# Patient Record
Sex: Male | Born: 1985
Health system: Southern US, Community
[De-identification: ages and names within clinical notes are randomized; demographics above are authoritative.]

## PROBLEM LIST (undated history)

## (undated) ENCOUNTER — Ambulatory Visit (HOSPITAL_COMMUNITY): Admission: EM | Payer: Self-pay | Source: Home / Self Care

## (undated) DIAGNOSIS — R7303 Prediabetes: Secondary | ICD-10-CM

## (undated) DIAGNOSIS — I4891 Unspecified atrial fibrillation: Secondary | ICD-10-CM

## (undated) DIAGNOSIS — J45909 Unspecified asthma, uncomplicated: Secondary | ICD-10-CM

## (undated) DIAGNOSIS — I10 Essential (primary) hypertension: Secondary | ICD-10-CM

## (undated) DIAGNOSIS — R51 Headache: Secondary | ICD-10-CM

## (undated) DIAGNOSIS — R519 Headache, unspecified: Secondary | ICD-10-CM

## (undated) HISTORY — PX: NO PAST SURGERIES: SHX2092

---
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
## 2006-01-20 ENCOUNTER — Emergency Department (HOSPITAL_COMMUNITY): Admission: EM | Admit: 2006-01-20 | Discharge: 2006-01-20 | Payer: Self-pay | Admitting: Emergency Medicine

## 2013-12-26 ENCOUNTER — Emergency Department (HOSPITAL_COMMUNITY)
Admission: EM | Admit: 2013-12-26 | Discharge: 2013-12-26 | Disposition: A | Discharge status: Home / Self Care | Payer: Self-pay | Attending: Emergency Medicine | Admitting: Emergency Medicine

## 2013-12-26 ENCOUNTER — Encounter (HOSPITAL_COMMUNITY): Payer: Self-pay | Admitting: Emergency Medicine

## 2013-12-26 DIAGNOSIS — K089 Disorder of teeth and supporting structures, unspecified: Secondary | ICD-10-CM | POA: Insufficient documentation

## 2013-12-26 DIAGNOSIS — K0889 Other specified disorders of teeth and supporting structures: Secondary | ICD-10-CM

## 2013-12-26 DIAGNOSIS — F17200 Nicotine dependence, unspecified, uncomplicated: Secondary | ICD-10-CM | POA: Insufficient documentation

## 2013-12-26 DIAGNOSIS — K029 Dental caries, unspecified: Secondary | ICD-10-CM | POA: Insufficient documentation

## 2013-12-26 DIAGNOSIS — K006 Disturbances in tooth eruption: Secondary | ICD-10-CM | POA: Insufficient documentation

## 2013-12-26 MED ORDER — TRAMADOL-ACETAMINOPHEN 37.5-325 MG PO TABS: 1.0000 | ORAL_TABLET | Freq: Four times a day (QID) | ORAL | Status: DC | PRN

## 2013-12-26 NOTE — ED Provider Notes (Signed)
CSN: [id number]     Arrival date & time 12/26/13  1648 History  This chart was scribed for Sharilyn SitesLisa Jefry Lesinski, PA, working with Glynn OctaveStephen Rancour, MD, by Bronson CurbJacqueline Melvin, ED Scribe. This patient was seen in room TR06C/TR06C and the patient's care was started at 5:04 PM.    Chief Complaint  Patient presents with  . Dental Pain     The history is provided by the patient. No language interpreter was used.    HPI Comments: Jon Nolan is a 28 y.o. male who presents to the Emergency Department complaining of left upper/lower dental pain that began 2 months ago. He states he needs to have a tooth extraction and root canal. Pain worse with chewing hard foods. He denies fever or trouble swallowing. Patient states he has taken Goody's Powder with no relief. Patient does not have a dentist.   History reviewed. No pertinent past medical history. History reviewed. No pertinent past surgical history. No family history on file. History  Substance Use Topics  . Smoking status: Current Every Day Smoker  . Smokeless tobacco: Not on file  . Alcohol Use: Yes    Review of Systems  Constitutional: Negative for fever.  HENT: Positive for dental problem. Negative for trouble swallowing.   All other systems reviewed and are negative.     Allergies  Review of patient's allergies indicates no known allergies.  Home Medications   Prior to Admission medications   Not on File   Triage Vitals: BP 163/111  Pulse 90  Temp(Src) 99 F (37.2 C) (Oral)  Resp 16  Wt 170 lb (77.111 kg)  SpO2 100%  Physical Exam  Nursing note and vitals reviewed. Constitutional: He is oriented to person, place, and time. He appears well-developed and well-nourished.  HENT:  Head: Normocephalic and atraumatic.  Mouth/Throat: Uvula is midline, oropharynx is clear and moist and mucous membranes are normal. Abnormal dentition. Dental caries present. No dental abscesses. No oropharyngeal exudate, posterior oropharyngeal  edema, posterior oropharyngeal erythema or tonsillar abscesses.  Teeth largely in poor dentition, several molars broken with large cavities present, surrounding gingiva noral in appearance without swelling, erythema, or signs of abscess, handling secretions appropriately, no trismus  Eyes: Conjunctivae and EOM are normal. Pupils are equal, round, and reactive to light.  Neck: Normal range of motion.  Cardiovascular: Normal rate, regular rhythm and normal heart sounds.   Pulmonary/Chest: Effort normal and breath sounds normal. No respiratory distress. He has no wheezes.  Musculoskeletal: Normal range of motion.  Neurological: He is alert and oriented to person, place, and time.  Skin: Skin is warm and dry.  Psychiatric: He has a normal mood and affect.    ED Course  Procedures (including critical care time)  DIAGNOSTIC STUDIES: Oxygen Saturation is 100% on room air, normal by my interpretation.    COORDINATION OF CARE: At 1714 Discussed treatment plan with patient which includes Ultracet. Patient agrees.   Labs Review Labs Reviewed - No data to display  Imaging Review No results found.   EKG Interpretation None      MDM   Final diagnoses:  Pain, dental   Dental pain without signs of dental abscess/infection.  ultracet for pain, referral to dentist.  Discussed plan with patient, he/she acknowledged understanding and agreed with plan of care.  Return precautions given for new or worsening symptoms.  I personally performed the services described in this documentation, which was scribed in my presence. The recorded information has been reviewed and is accurate.  Garlon HatchetLisa M Shalise Rosado, PA-C 12/26/13 1921

## 2013-12-26 NOTE — ED Notes (Signed)
Pt c/o left upper and lower dental pain for several months; reports he needs to have root canal and tooth extraction.

## 2013-12-26 NOTE — Discharge Instructions (Signed)
Take the prescribed medication as directed. Follow-up with a dentist.  Referrals and resource guide provided to help with this. Return to the ED for new or worsening symptoms.   Emergency Department Resource Guide 1) Find a Doctor and Pay Out of Pocket Although you won't have to find out who is covered by your insurance plan, it is a good idea to ask around and get recommendations. You will then need to call the office and see if the doctor you have chosen will accept you as a new patient and what types of options they offer for patients who are self-pay. Some doctors offer discounts or will set up payment plans for their patients who do not have insurance, but you will need to ask so you aren't surprised when you get to your appointment.  2) Contact Your Local Health Department Not all health departments have doctors that can see patients for sick visits, but many do, so it is worth a call to see if yours does. If you don't know where your local health department is, you can check in your phone book. The CDC also has a tool to help you locate your state's health department, and many state websites also have listings of all of their local health departments.  3) Find a Walk-in Clinic If your illness is not likely to be very severe or complicated, you may want to try a walk in clinic. These are popping up all over the country in pharmacies, drugstores, and shopping centers. They're usually staffed by nurse practitioners or physician assistants that have been trained to treat common illnesses and complaints. They're usually fairly quick and inexpensive. However, if you have serious medical issues or chronic medical problems, these are probably not your best option.  No Primary Care Doctor: - Call Health Connect at  [phone] - they can help you locate a primary care doctor that  accepts your insurance, provides certain services, etc. - Physician Referral Service- [phone]  Chronic Pain  Problems: Organization         Address  Phone   Notes  [facility]  [phone] Patients need to be referred by their primary care doctor.   Medication Assistance: Organization         Address  Phone   Notes  [facility] [address] [place], [place] [address] [phone] --Must be a resident of [facility] [place] -- Must have NO insurance coverage whatsoever (no Medicaid/ Medicare, etc.) -- The pt. MUST have a primary care doctor that directs their care regularly and follows them in the community   MedAssist  [phone]   [employer]  [phone]    Agencies that provide inexpensive medical care: Organization         Address  Phone   Notes  [facility]  [phone]   [facility]    [phone]   [facility] [address] [place], [place] [address] [phone]   [facility] [address], [place] [phone]) [phone]   [facility]anned [facility]    [phone]   [facility]    [phone]   [facility]  [address], [place] Phone:  [phone], Fax:  [phone] Hours of Operation:  9 am - 6 pm, M-F.  Also accepts Medicaid/Medicare and self-pay.  [facility]  [address]  [place], [place], [place] Phone: [phone], Fax: [phone]. Hours of Operation:  8:30 am - 5:30 pm, M-F.  Also accepts Medicaid and self-pay.  [facility] [place] [address], [place] Phone: [phone]   [place], [place], [place] [phone], Ext. [phone] Mondays & Thursdays: 7-9 AM.  First 15 patients are seen on a first come, first serve basis.    [place] Providers:  Organization         Address  Phone   Notes  [facility] [address], [place] [phone] Also  accepts self-pay patients.  [facility] [address], [place]  [phone]   [place], [address], [place] [phone]   [facility] [address] [place] [phone]   Lucianne Lei [address], [place]   [phone] Only accepts [place] Access [place] patients after they have their name applied to their card.   Self-Pay (no insurance) in [facility]:  Organization         Address  Phone   Notes  Sickle Cell Patients, [facility] [place] [phone]   [facility] [place] [phone]   [facility] [place]  [place], [place],  [phone]   [facility]/Dr. Osei-Bonsu  [address] [place] or [address], [place] [phone] Phone number for both [place] and [place] locations is the same.  [facility] [address], [place] [phone]   [facility] [address] [place] or [address] [phone]   [facility] [address], [place] [phone], phone; [phone], fax Sees patients 1st and 3rd Saturday of every month.  Must not qualify for public or private insurance (i.e. Medicaid, Medicare, [place] Health Choice, Veterans' Benefits)  Household income should be no more than 200% of the poverty level The clinic cannot treat you if you are pregnant or think you are pregnant  Sexually transmitted diseases are not treated at the clinic.    Dental Care: Organization         Address  Phone  Notes  [facility] [place] [phone] Accepts children up to age 34 who are enrolled in [place] or [place]; pregnant  women with a Medicaid card; and children who have applied for Medicaid or [place] Health Choice, but were declined, whose parents can pay a reduced fee at time of service.  [facility]  [address], [place] [phone] Accepts children up to age 24 who are enrolled in [place] or [place]; pregnant women with a Medicaid card; and children who have applied for Medicaid or [place] Health Choice, but were declined, whose parents can pay a reduced fee at time of service.  [facility]  [place] [phone] Patients are seen by appointment only. Walk-ins are not accepted. [place] will see patients 57 years of age and older. Monday - Tuesday (8am-5pm) Most Wednesdays (8:30-5pm) $30 per visit, cash only  [place]  [address], [place] [phone] Patients are seen by appointment only. Walk-ins are not accepted. [facility] will see patients 18 years of age and older. °One Wednesday Evening (Monthly: Volunteer Based).  $30 per visit, cash only  °[facility]  [phone] for adults; Children under age 4, call Graduate Pediatric Dentistry at [phone]. Children aged 4-14, please call [phone] to request a pediatric application. ° Dental services are provided in all areas of dental care including fillings, crowns and bridges, complete and partial dentures, implants, gum treatment, root canals, and extractions. Preventive care is also provided. Treatment is provided to both adults and children. °Patients are selected via a lottery and there is often a waiting list. °  °[facility] [address], °[place] ° [phone] [email or web address] °  °[facility] [address], [place], [place] [phone], Ext. [phone] Second and Fourth Thursday of each month, opens at 6:30 AM; Clinic ends at 9 AM.  Patients are  seen on a first-come first-served basis, and a limited number are seen during each clinic.  ° °[facility] ° [address], [place], [place] [phone]   Eligibility Requirements °You must have lived in [place], [place], or [place] counties for at least the last three months. °  You cannot be eligible for state or federal sponsored healthcare insurance, including Veterans Administration, Medicaid, or Medicare. °  You generally cannot be eligible for healthcare insurance through your employer.  °  How to apply: °Eligibility screenings are held every Tuesday and Wednesday afternoon from 1:00 pm until 4:00 pm. You do not need an appointment for the interview!  °[facility] [address], [place], [place] [phone]   °[facility]  [phone]   °[facility]  [phone]   °[facility]  [phone]   ° °Behavioral Health Resources in the Community: °Intensive Outpatient Programs °Organization         Address  Phone  Notes  °[facility] [address], [place], [place] [phone]   °[facility] [address], [place], [place] [phone]   °[facility] [address], [place], [place] ° [phone]   °[facility] [address],  °[place], [place] 1-800-853-5163 or 336-641-4981   °Substance Abuse Resources °Organization         Address  Phone  Notes  °[facility]  336-882-2125   °[facility]  336-784-9470   °[facility]  336-285-9073   °[facility]  336-845-3988   °Residential & Outpatient Substance Abuse Program  1-800-659-3381   °Psychological Services °Organization         Address  Phone  Notes  ° Health  336- [phone]   °[facility]  336- 378-7881   °[facility] [address], [place] 1-800-853-5163 or 336-641-4981   ° °Mobile Crisis  Teams °Organization         Address  Phone  Notes  °[facility], Mobile Crisis Care Unit  1-877-626-1772   °Assertive °Psychotherapeutic Services ° [address] [place], [place] 336-834-9664   °Sharon DeEsch [address] °[place] 336-554-5454   ° °Self-Help/Support Groups °Organization         Address  Phone             Notes  °[facility] [place] -   variety of support groups  336- [phone] Call for more information  Narcotics Anonymous (NA), [facility] [address], [employer] [place]  2 meetings at this location   Residential Facilities manager         Address  Phone  Notes  [facility] Residential Treatment [facility],    [place]  [phone]   [facility]  [address] [place] [address], [place], [place]   [place], [place] [phone] Admissions: 8am-3pm M-F  Incentives Substance [place] [address]    [place], [place] [phone]   [facility] [address] [place], [place], [place]   [facility] [address],  [place], [place]   [facility] - Intensive Outpatient [address], [place], [place]   [facility] ([place] [employer].,  [place], [place] 1-(248)451-1238 or [phone]   [facility] ([facility]) [address], [place], [place] Accepts Medicaid  [facility] [place] [address],  [place] 1-581-742-9571 Substance Abuse/Addiction Treatment   [facility] Organization         Address  Phone  Notes  [facility]  [phone])[phone]   Domenic Schwab, PhD [address] [place]lis [place], [place]   3230477266 or [phone]   [place], [place] [phone]   [facility] [address], [place], [place] [phone]  Insurance/Medicaid/sponsorship through [facility] and Families [address], [address] [place]                                    [place], [place] [phone] [place] [address], [place] [phone]    Dr. Adele Schilder  [phone]   [facility] Dept. 1) [address] [place] 2) [place] 3)  [place] [address], [place] [phone]  [phone]   [place] [phone] or [phone] (After Hours)

## 2013-12-27 NOTE — ED Provider Notes (Signed)
Medical screening examination/treatment/procedure(s) were performed by non-physician practitioner and as supervising physician I was immediately available for consultation/collaboration.   EKG Interpretation None        Jamarr Treinen, MD 12/27/13 0047 

## 2014-02-19 ENCOUNTER — Encounter (HOSPITAL_COMMUNITY): Payer: Self-pay | Admitting: Emergency Medicine

## 2014-02-19 ENCOUNTER — Emergency Department (HOSPITAL_COMMUNITY)
Admission: EM | Admit: 2014-02-19 | Discharge: 2014-02-19 | Disposition: A | Discharge status: Home / Self Care | Payer: Self-pay | Attending: Emergency Medicine | Admitting: Emergency Medicine

## 2014-02-19 DIAGNOSIS — R221 Localized swelling, mass and lump, neck: Secondary | ICD-10-CM

## 2014-02-19 DIAGNOSIS — J45909 Unspecified asthma, uncomplicated: Secondary | ICD-10-CM | POA: Insufficient documentation

## 2014-02-19 DIAGNOSIS — F17200 Nicotine dependence, unspecified, uncomplicated: Secondary | ICD-10-CM | POA: Insufficient documentation

## 2014-02-19 DIAGNOSIS — K044 Acute apical periodontitis of pulpal origin: Secondary | ICD-10-CM | POA: Insufficient documentation

## 2014-02-19 DIAGNOSIS — R220 Localized swelling, mass and lump, head: Secondary | ICD-10-CM | POA: Insufficient documentation

## 2014-02-19 DIAGNOSIS — R112 Nausea with vomiting, unspecified: Secondary | ICD-10-CM | POA: Insufficient documentation

## 2014-02-19 DIAGNOSIS — K047 Periapical abscess without sinus: Secondary | ICD-10-CM

## 2014-02-19 DIAGNOSIS — IMO0001 Reserved for inherently not codable concepts without codable children: Secondary | ICD-10-CM | POA: Insufficient documentation

## 2014-02-19 HISTORY — DX: Unspecified asthma, uncomplicated: J45.909

## 2014-02-19 LAB — CBC WITH DIFFERENTIAL/PLATELET
Basophils Absolute: 0 10*3/uL (ref 0.0–0.1)
Basophils Relative: 0 % (ref 0–1)
Eosinophils Absolute: 0 10*3/uL (ref 0.0–0.7)
Eosinophils Relative: 0 % (ref 0–5)
HCT: 42 % (ref 39.0–52.0)
HEMOGLOBIN: 14.1 g/dL (ref 13.0–17.0)
LYMPHS ABS: 0.5 10*3/uL — AB (ref 0.7–4.0)
Lymphocytes Relative: 5 % — ABNORMAL LOW (ref 12–46)
MCH: 29 pg (ref 26.0–34.0)
MCHC: 33.6 g/dL (ref 30.0–36.0)
MCV: 86.4 fL (ref 78.0–100.0)
MONOS PCT: 8 % (ref 3–12)
Monocytes Absolute: 0.9 10*3/uL (ref 0.1–1.0)
NEUTROS ABS: 9.1 10*3/uL — AB (ref 1.7–7.7)
NEUTROS PCT: 87 % — AB (ref 43–77)
PLATELETS: 193 10*3/uL (ref 150–400)
RBC: 4.86 MIL/uL (ref 4.22–5.81)
RDW: 14.3 % (ref 11.5–15.5)
WBC: 10.6 10*3/uL — ABNORMAL HIGH (ref 4.0–10.5)

## 2014-02-19 LAB — BASIC METABOLIC PANEL
Anion gap: 16 — ABNORMAL HIGH (ref 5–15)
BUN: 13 mg/dL (ref 6–23)
CHLORIDE: 101 meq/L (ref 96–112)
CO2: 23 mEq/L (ref 19–32)
Calcium: 9.3 mg/dL (ref 8.4–10.5)
Creatinine, Ser: 1.7 mg/dL — ABNORMAL HIGH (ref 0.50–1.35)
GFR calc non Af Amer: 54 mL/min — ABNORMAL LOW (ref >90)
GFR, EST AFRICAN AMERICAN: 63 mL/min — AB (ref >90)
Glucose, Bld: 120 mg/dL — ABNORMAL HIGH (ref 70–99)
Potassium: 3.9 mEq/L (ref 3.7–5.3)
SODIUM: 140 meq/L (ref 137–147)

## 2014-02-19 MED ORDER — IBUPROFEN 600 MG PO TABS: 600.0000 mg | ORAL_TABLET | Freq: Four times a day (QID) | ORAL | Status: DC | PRN

## 2014-02-19 MED ORDER — CLINDAMYCIN PHOSPHATE 600 MG/50ML IV SOLN
600.0000 mg | Freq: Once | INTRAVENOUS | Status: AC
  Administered 2014-02-19: 600 mg via INTRAVENOUS
  Filled 2014-02-19: qty 50

## 2014-02-19 MED ORDER — CLINDAMYCIN HCL 300 MG PO CAPS
300.0000 mg | ORAL_CAPSULE | Freq: Four times a day (QID) | ORAL | Status: DC
Start: 2014-02-19 — End: 2014-04-07

## 2014-02-19 MED ORDER — ONDANSETRON 4 MG PO TBDP: ORAL_TABLET | ORAL | Status: DC

## 2014-02-19 MED ORDER — ONDANSETRON HCL 4 MG/2ML IJ SOLN
4.0000 mg | Freq: Once | INTRAMUSCULAR | Status: AC
  Administered 2014-02-19: 4 mg via INTRAVENOUS
  Filled 2014-02-19: qty 2

## 2014-02-19 MED ORDER — SODIUM CHLORIDE 0.9 % IV BOLUS (SEPSIS)
1000.0000 mL | Freq: Once | INTRAVENOUS | Status: AC
  Administered 2014-02-19: 1000 mL via INTRAVENOUS

## 2014-02-19 MED ORDER — ACETAMINOPHEN 325 MG PO TABS
650.0000 mg | ORAL_TABLET | Freq: Four times a day (QID) | ORAL | Status: DC | PRN
  Administered 2014-02-19: 650 mg via ORAL
  Filled 2014-02-19: qty 2

## 2014-02-19 MED ORDER — KETOROLAC TROMETHAMINE 30 MG/ML IJ SOLN
30.0000 mg | Freq: Once | INTRAMUSCULAR | Status: AC
  Administered 2014-02-19: 30 mg via INTRAVENOUS
  Filled 2014-02-19: qty 1

## 2014-02-19 MED ORDER — HYDROCODONE-ACETAMINOPHEN 5-325 MG PO TABS: 2.0000 | ORAL_TABLET | ORAL | Status: DC | PRN

## 2014-02-19 NOTE — ED Notes (Signed)
Patient reports no nausea after consuming ginger ale at the bedside.

## 2014-02-19 NOTE — ED Provider Notes (Signed)
CSN: [id number]     Arrival date & time 02/19/14  0007 History   First MD Initiated Contact with Patient 02/19/14 0108     Chief Complaint  Patient presents with  . Chills  . Emesis     (Consider location/radiation/quality/duration/timing/severity/associated sxs/prior Treatment) HPI Patient with known dental disease states he's been unable to see a dentist due to expenses. He states he has 5 teeth that need to be pulled. Today he began having gingival swelling in the left lower molar region. He's had fevers and chills. He's had diffuse myalgias. Patient states he's had several episodes of vomiting. He denies any sore throat or difficulty swallowing. He's had no shortness of breath. Patient states he thinks he's had some drainage from the swelling around his gums. Past Medical History  Diagnosis Date  . Asthma    History reviewed. No pertinent past surgical history. History reviewed. No pertinent family history. History  Substance Use Topics  . Smoking status: Current Every Day Smoker  . Smokeless tobacco: Not on file  . Alcohol Use: Yes    Review of Systems  Constitutional: Positive for fever, chills and fatigue.  HENT: Positive for dental problem and facial swelling. Negative for sinus pressure and sore throat.   Respiratory: Negative for cough and shortness of breath.   Cardiovascular: Negative for chest pain.  Gastrointestinal: Positive for nausea and vomiting. Negative for abdominal pain and diarrhea.  Musculoskeletal: Positive for myalgias. Negative for back pain, neck pain and neck stiffness.  Skin: Negative for rash and wound.  Neurological: Negative for dizziness, weakness, light-headedness and numbness.  All other systems reviewed and are negative.     Allergies  Review of patient's allergies indicates no known allergies.  Home Medications   Prior to Admission medications   Not on File   BP 145/87  Pulse 81  Temp(Src) 100.5 F (38.1 C) (Oral)  Resp 16   SpO2 98% Physical Exam  Nursing note and vitals reviewed. Constitutional: He is oriented to person, place, and time. He appears well-developed and well-nourished. No distress.  HENT:  Head: Normocephalic and atraumatic.  Mouth/Throat: Oropharynx is clear and moist. No oropharyngeal exudate.    Eyes: EOM are normal. Pupils are equal, round, and reactive to light.  Neck: Normal range of motion. Neck supple.  No meningismus  Cardiovascular: Normal rate and regular rhythm.   Pulmonary/Chest: Effort normal and breath sounds normal. No respiratory distress. He has no wheezes. He has no rales. He exhibits no tenderness.  Abdominal: Soft. Bowel sounds are normal. He exhibits no distension and no mass. There is no tenderness. There is no rebound and no guarding.  Musculoskeletal: Normal range of motion. He exhibits no edema and no tenderness.  No calf tenderness or swelling.  Neurological: He is alert and oriented to person, place, and time.  Moves all extremities without deficit. Sensation is grossly intact.  Skin: Skin is warm and dry. No rash noted. No erythema.  Psychiatric: He has a normal mood and affect. His behavior is normal.    ED Course  Procedures (including critical care time) Labs Review Labs Reviewed  CBC WITH DIFFERENTIAL - Abnormal; Notable for the following:    WBC 10.6 (*)    Neutrophils Relative % 87 (*)    Neutro Abs 9.1 (*)    Lymphocytes Relative 5 (*)    Lymphs Abs 0.5 (*)    All other components within normal limits  BASIC METABOLIC PANEL - Abnormal; Notable for the following:  Glucose, Bld 120 (*)    Creatinine, Ser 1.70 (*)    GFR calc non Af Amer 54 (*)    GFR calc Af Amer 63 (*)    Anion gap 16 (*)    All other components within normal limits    Imaging Review No results found.   EKG Interpretation None      MDM   Final diagnoses:  None    Patient with likely spontaneously draining periapical dental abscess. We'll give IV fluids, and  NSAID and antibiotics. Patient will need to followup with a dentist for extraction.  Patient is feeling much better after medications and IV fluids. Heart rate improved. Patient is tolerating oral fluid. Encouraged to followup with a dentist. Return precautions given  Loren Raceravid Kenny Rea, MD 02/19/14 [phone]

## 2014-02-19 NOTE — ED Notes (Signed)
Provided ginger ale as requested ?

## 2014-02-19 NOTE — ED Notes (Signed)
Presents with chills, fever, vomiting and bodyaches. Pt states, "I have five teeth I am supposed to have removed but now my gums are swollen and they have never been swollen. I have been having chills and vomiting and I can't keep anything on my stomach" denies diarrhea.

## 2014-02-19 NOTE — Discharge Instructions (Signed)

## 2014-04-07 ENCOUNTER — Emergency Department (HOSPITAL_COMMUNITY)
Admission: EM | Admit: 2014-04-07 | Discharge: 2014-04-07 | Disposition: A | Discharge status: Home / Self Care | Payer: Self-pay | Attending: Emergency Medicine | Admitting: Emergency Medicine

## 2014-04-07 ENCOUNTER — Encounter (HOSPITAL_COMMUNITY): Payer: Self-pay | Admitting: Emergency Medicine

## 2014-04-07 DIAGNOSIS — F17200 Nicotine dependence, unspecified, uncomplicated: Secondary | ICD-10-CM | POA: Insufficient documentation

## 2014-04-07 DIAGNOSIS — R519 Headache, unspecified: Secondary | ICD-10-CM

## 2014-04-07 DIAGNOSIS — R51 Headache: Secondary | ICD-10-CM

## 2014-04-07 DIAGNOSIS — R109 Unspecified abdominal pain: Secondary | ICD-10-CM | POA: Insufficient documentation

## 2014-04-07 DIAGNOSIS — J45909 Unspecified asthma, uncomplicated: Secondary | ICD-10-CM | POA: Insufficient documentation

## 2014-04-07 LAB — COMPREHENSIVE METABOLIC PANEL
ALK PHOS: 43 U/L (ref 39–117)
ALT: 19 U/L (ref 0–53)
ANION GAP: 13 (ref 5–15)
AST: 28 U/L (ref 0–37)
Albumin: 3.5 g/dL (ref 3.5–5.2)
BILIRUBIN TOTAL: 1.1 mg/dL (ref 0.3–1.2)
BUN: 9 mg/dL (ref 6–23)
CHLORIDE: 104 meq/L (ref 96–112)
CO2: 23 mEq/L (ref 19–32)
Calcium: 8.5 mg/dL (ref 8.4–10.5)
Creatinine, Ser: 1.15 mg/dL (ref 0.50–1.35)
GFR calc Af Amer: >90 mL/min (ref >90)
GFR calc non Af Amer: 87 mL/min — ABNORMAL LOW (ref >90)
Glucose, Bld: 95 mg/dL (ref 70–99)
Potassium: 4.1 mEq/L (ref 3.7–5.3)
SODIUM: 140 meq/L (ref 137–147)
Total Protein: 6.1 g/dL (ref 6.0–8.3)

## 2014-04-07 LAB — URINALYSIS, ROUTINE W REFLEX MICROSCOPIC
Bilirubin Urine: NEGATIVE
GLUCOSE, UA: NEGATIVE mg/dL
HGB URINE DIPSTICK: NEGATIVE
KETONES UR: 40 mg/dL — AB
LEUKOCYTES UA: NEGATIVE
Nitrite: NEGATIVE
PH: 6.5 (ref 5.0–8.0)
Protein, ur: NEGATIVE mg/dL
Specific Gravity, Urine: 1.023 (ref 1.005–1.030)
Urobilinogen, UA: 0.2 mg/dL (ref 0.0–1.0)

## 2014-04-07 LAB — CBC
HEMATOCRIT: 41.3 % (ref 39.0–52.0)
HEMOGLOBIN: 14.1 g/dL (ref 13.0–17.0)
MCH: 29.4 pg (ref 26.0–34.0)
MCHC: 34.1 g/dL (ref 30.0–36.0)
MCV: 86 fL (ref 78.0–100.0)
Platelets: 212 10*3/uL (ref 150–400)
RBC: 4.8 MIL/uL (ref 4.22–5.81)
RDW: 14.3 % (ref 11.5–15.5)
WBC: 10.5 10*3/uL (ref 4.0–10.5)

## 2014-04-07 LAB — LIPASE, BLOOD: Lipase: 19 U/L (ref 11–59)

## 2014-04-07 MED ORDER — DIPHENHYDRAMINE HCL 25 MG PO CAPS
25.0000 mg | ORAL_CAPSULE | Freq: Once | ORAL | Status: AC
  Administered 2014-04-07: 25 mg via ORAL
  Filled 2014-04-07: qty 1

## 2014-04-07 MED ORDER — PROCHLORPERAZINE EDISYLATE 5 MG/ML IJ SOLN
10.0000 mg | Freq: Once | INTRAMUSCULAR | Status: DC
  Filled 2014-04-07: qty 2

## 2014-04-07 MED ORDER — KETOROLAC TROMETHAMINE 60 MG/2ML IM SOLN
60.0000 mg | Freq: Once | INTRAMUSCULAR | Status: DC
  Filled 2014-04-07: qty 2

## 2014-04-07 MED ORDER — KETOROLAC TROMETHAMINE 30 MG/ML IJ SOLN
30.0000 mg | Freq: Once | INTRAMUSCULAR | Status: AC
  Administered 2014-04-07: 30 mg via INTRAVENOUS
  Filled 2014-04-07: qty 1

## 2014-04-07 MED ORDER — NAPROXEN 500 MG PO TABS: 500.0000 mg | ORAL_TABLET | Freq: Two times a day (BID) | ORAL | Status: DC

## 2014-04-07 MED ORDER — PROCHLORPERAZINE EDISYLATE 5 MG/ML IJ SOLN
10.0000 mg | Freq: Once | INTRAMUSCULAR | Status: AC
  Administered 2014-04-07: 10 mg via INTRAVENOUS

## 2014-04-07 NOTE — ED Notes (Signed)
Patient encouraged to give a urine sample again at this time.

## 2014-04-07 NOTE — ED Provider Notes (Signed)
CSN: [id number]     Arrival date & time 04/07/14  0148 History   First MD Initiated Contact with Patient 04/07/14 0203     Chief Complaint  Patient presents with  . Abdominal Pain     (Consider location/radiation/quality/duration/timing/severity/associated sxs/prior Treatment) HPI Comments: 28 year old male with no significant past medical history, no prior abdominal surgical history and no history of cholecystitis, pancreatitis or appendicitis. He had acute onset of left upper and left mid abdominal pain that occurred approximately 2 hours prior to arrival, is a constant sensation, associated with nausea and vomiting and is gradually improving. He states that it is sharp and stabbing, does not radiate to his back nor does it radiate to his flank, scrotum or testicles. He denies any changes in his bowel habits including no diarrhea, no constipation, no blood in the stools and he has no dysuria, hematuria or any other complaints. There has been no fevers or chills, yesterday was a fairly normal day without any decrease in appetite or any other systemic complaints. He does report that he has developed a headache over the last hour, he states that he does get frequent headaches. He does have sensitivity to light.  Patient is a 28 y.o. male presenting with abdominal pain. The history is provided by the patient.  Abdominal Pain   Past Medical History  Diagnosis Date  . Asthma    History reviewed. No pertinent past surgical history. No family history on file. History  Substance Use Topics  . Smoking status: Current Every Day Smoker  . Smokeless tobacco: Not on file  . Alcohol Use: Yes    Review of Systems  Gastrointestinal: Positive for abdominal pain.  All other systems reviewed and are negative.     Allergies  Review of patient's allergies indicates no known allergies.  Home Medications   Prior to Admission medications   Medication Sig Start Date End Date Taking? Authorizing  Provider  clindamycin (CLEOCIN) 300 MG capsule Take 1 capsule (300 mg total) by mouth 4 (four) times daily. X 7 days 02/19/14   Loren Racer, MD  HYDROcodone-acetaminophen [facility]) 5-325 MG per tablet Take 2 tablets by mouth every 4 (four) hours as needed. 02/19/14   Loren Racer, MD  ibuprofen (ADVIL,MOTRIN) 600 MG tablet Take 1 tablet (600 mg total) by mouth every 6 (six) hours as needed. 02/19/14   Loren Racer, MD  ondansetron (ZOFRAN ODT) 4 MG disintegrating tablet  ODT q4 hours prn nausea/vomit 02/19/14   Loren Racer, MD   BP 142/82  Temp(Src) 98.3 F (36.8 C) (Oral)  Resp 12  SpO2 98% Physical Exam  Nursing note and vitals reviewed. Constitutional: He appears well-developed and well-nourished. No distress.  HENT:  Head: Normocephalic and atraumatic.  Mouth/Throat: Oropharynx is clear and moist. No oropharyngeal exudate.  Eyes: Conjunctivae and EOM are normal. Pupils are equal, round, and reactive to light. Right eye exhibits no discharge. Left eye exhibits no discharge. No scleral icterus.  Neck: Normal range of motion. Neck supple. No JVD present. No thyromegaly present.  Cardiovascular: Normal rate, regular rhythm, normal heart sounds and intact distal pulses.  Exam reveals no gallop and no friction rub.   No murmur heard. Pulmonary/Chest: Effort normal and breath sounds normal. No respiratory distress. He has no wheezes. He has no rales.  Abdominal: Soft. Bowel sounds are normal. He exhibits no distension and no mass. There is tenderness (mild left upper quadrant, left midabdomen and left lower quadrant tenderness).  No CVA tenderness  Musculoskeletal: Normal  range of motion. He exhibits no edema and no tenderness.  Lymphadenopathy:    He has no cervical adenopathy.  Neurological: He is alert. Coordination normal.  Skin: Skin is warm and dry. No rash noted. No erythema.  Psychiatric: He has a normal mood and affect. His behavior is normal.    ED Course   Procedures (including critical care time) Labs Review Labs Reviewed  COMPREHENSIVE METABOLIC PANEL  LIPASE, BLOOD  CBC  URINALYSIS, ROUTINE W REFLEX MICROSCOPIC    Imaging Review No results found.    MDM   Final diagnoses:  None     There is no pain on the right side of the abdomen, no right upper quadrant or right lower quadrant tenderness, no peritoneal signs, normal bowel sounds and no diarrhea. Possible kidney stone, less likely to be cholecystitis or pancreatitis or peptic ulcer disease. The patient does endorse some alcohol use but denies over-the-counter medication use. At this time laboratory workup, Toradol, Compazine, Benadryl.  Improved and has been sleeping after medications, no pain on reexam, headache improved, vital signs normal  Meds given in ED:  Medications  diphenhydrAMINE (BENADRYL) capsule 25 mg (25 mg Oral Given 04/07/14 0229)  ketorolac (TORADOL) 30 MG/ML injection 30 mg (30 mg Intravenous Given 04/07/14 0229)  prochlorperazine (COMPAZINE) injection 10 mg (10 mg Intravenous Given 04/07/14 0229)    New Prescriptions   NAPROXEN (NAPROSYN) 500 MG TABLET    Take 1 tablet (500 mg total) by mouth 2 (two) times daily with a meal.   '  Vida Roller, MD 04/07/14 [phone]

## 2014-04-07 NOTE — ED Notes (Signed)
Patient presents to ED via [facility] from home. Patient states that he had a sudden onset of left upper and lower abdominal pain with associated nausea and vomiting. Denies any diarrhea. Tenderness noted to left side. VSS. EMS administered 4 mg zofran en route- patient states that it helped. A&Ox4. No acute distress noted at this time.

## 2014-04-07 NOTE — ED Notes (Signed)
Patient still has not provided a urine sample, but encouraged to do so.

## 2014-04-07 NOTE — Discharge Instructions (Signed)
°Emergency Department Resource Guide °1) Find a Doctor and Pay Out of Pocket °Although you won't have to find out who is covered by your insurance plan, it is a good idea to ask around and get recommendations. You will then need to call the office and see if the doctor you have chosen will accept you as a new patient and what types of options they offer for patients who are self-pay. Some doctors offer discounts or will set up payment plans for their patients who do not have insurance, but you will need to ask so you aren't surprised when you get to your appointment. ° °2) Contact Your Local Health Department °Not all health departments have doctors that can see patients for sick visits, but many do, so it is worth a call to see if yours does. If you don't know where your local health department is, you can check in your phone book. The CDC also has a tool to help you locate your state's health department, and many state websites also have listings of all of their local health departments. ° °3) Find a Walk-in Clinic °If your illness is not likely to be very severe or complicated, you may want to try a walk in clinic. These are popping up all over the country in pharmacies, drugstores, and shopping centers. They're usually staffed by nurse practitioners or physician assistants that have been trained to treat common illnesses and complaints. They're usually fairly quick and inexpensive. However, if you have serious medical issues or chronic medical problems, these are probably not your best option. ° °No Primary Care Doctor: °- Call [facility] at  [phone] - they can help you locate a primary care doctor that  accepts your insurance, provides certain services, etc. °- Physician Referral Service- 1-800-533-3463 ° °Chronic Pain Problems: °Organization         Address  Phone   Notes  °[facility]  [phone] Patients need to be referred by their primary care doctor.  ° °Medication  Assistance: °Organization         Address  Phone   Notes  °[place] [facility] [address] °[place], [place] [address] [phone]) [phone] --Must be a resident of [place] °-- Must have NO insurance coverage whatsoever (no Medicaid/ Medicare, etc.) °-- The pt. MUST have a primary care doctor that directs their care regularly and follows them in the community °  °[facility]  (866) 331-1348   °[employer]  (888) 892-1162   ° °Agencies that provide inexpensive medical care: °Organization         Address  Phone   Notes  °[facility]  ([phone]) [phone]   °[facility]    ([phone]) [phone]   °[facility] [address] °[place],  [address] [phone]) [phone]   °[facility] [address], °[place] ([phone]) [phone]   °[facility]    ([phone]) 373-0678   °[facility]    ([phone]) [phone]   °[facility] ° [address], [place] Phone:  ([phone]) [phone], Fax:  ([phone] Hours of Operation:  9 am - 6 pm, M-F.  Also accepts Medicaid/Medicare and self-pay.  °[facility] ° [address], [place] Phone: ([phone]) [phone], Fax: [phone]. Hours of Operation:  8:30 am - 5:30 pm, M-F.  Also accepts Medicaid and self-pay.  °[facility] [place] [address], [place] Phone: ([phone]) [phone]   °[facility] [address], [place], [place] (336)[phone], Ext. 123 Mondays & Thursdays: 7-9 AM.  First 15 patients are seen on a first come, first serve basis. °  ° °Medicaid-accepting [place] Providers: ° °Organization         Address  Phone   Notes  °[facility] [address], [place] [phone]) [phone] Also accepts self-pay patients.  °[facility] [address], [place] ° ([phone]) [phone]   °[facility] [address], [place]  ([phone]) [phone]   °[facility] [address] [place] [address], [place] ([phone]) [phone]   °[facility] [address], [place]  ° ([phone]) [phone] Only accepts [place] [employer] Medicaid patients after they have their name applied to their card.  ° °Self-Pay (no insurance) in [place]: ° °Organization         Address  Phone   Notes  °Sickle Cell Patients, [facility] [address], [place] ([phone]) [phone]   °[facility] [address], [place] ([phone]) [phone]   ° [facility] ° [address], [place] ([phone]) [phone]   °[facility]/Dr. Osei-Bonsu ° [address] [place] [address], [place] or [address], [place] [phone] Phone number for both [place] and [place] locations is the same.  °[facility] [address], [place] ([phone]) [phone]   °[facility] [address] [place] [address], [place] or [address] ([phone]) [phone] °([phone]) [phone]   °[facility] [address],  ([phone]) [phone], phone; ([phone]) [phone], fax Sees patients 1st and 3rd Saturday of every month.  Must not qualify for public or private insurance (i.e. Medicaid, Medicare, [place] Health Choice, Veterans' Benefits) • Household income should be no more than 200% of the poverty level •The clinic cannot treat you if you are pregnant or think you are pregnant • Sexually transmitted diseases are not treated at the clinic.  ° °

## 2014-06-17 ENCOUNTER — Emergency Department (HOSPITAL_COMMUNITY)
Admission: EM | Admit: 2014-06-17 | Discharge: 2014-06-17 | Disposition: A | Discharge status: Home / Self Care | Payer: Self-pay | Attending: Emergency Medicine | Admitting: Emergency Medicine

## 2014-06-17 ENCOUNTER — Encounter (HOSPITAL_COMMUNITY): Payer: Self-pay

## 2014-06-17 DIAGNOSIS — J45909 Unspecified asthma, uncomplicated: Secondary | ICD-10-CM | POA: Insufficient documentation

## 2014-06-17 DIAGNOSIS — G43109 Migraine with aura, not intractable, without status migrainosus: Secondary | ICD-10-CM | POA: Insufficient documentation

## 2014-06-17 DIAGNOSIS — G43009 Migraine without aura, not intractable, without status migrainosus: Secondary | ICD-10-CM

## 2014-06-17 DIAGNOSIS — Z791 Long term (current) use of non-steroidal anti-inflammatories (NSAID): Secondary | ICD-10-CM | POA: Insufficient documentation

## 2014-06-17 DIAGNOSIS — Z720 Tobacco use: Secondary | ICD-10-CM | POA: Insufficient documentation

## 2014-06-17 MED ORDER — SODIUM CHLORIDE 0.9 % IV BOLUS (SEPSIS)
1000.0000 mL | Freq: Once | INTRAVENOUS | Status: AC
  Administered 2014-06-17: 1000 mL via INTRAVENOUS

## 2014-06-17 MED ORDER — KETOROLAC TROMETHAMINE 30 MG/ML IJ SOLN
30.0000 mg | Freq: Once | INTRAMUSCULAR | Status: AC
  Administered 2014-06-17: 30 mg via INTRAVENOUS
  Filled 2014-06-17: qty 1

## 2014-06-17 MED ORDER — MAGNESIUM SULFATE 2 GM/50ML IV SOLN
2.0000 g | Freq: Once | INTRAVENOUS | Status: AC
  Administered 2014-06-17: 2 g via INTRAVENOUS
  Filled 2014-06-17: qty 50

## 2014-06-17 MED ORDER — DIPHENHYDRAMINE HCL 50 MG/ML IJ SOLN
50.0000 mg | Freq: Once | INTRAMUSCULAR | Status: AC
  Administered 2014-06-17: 50 mg via INTRAVENOUS
  Filled 2014-06-17: qty 1

## 2014-06-17 MED ORDER — METOCLOPRAMIDE HCL 5 MG/ML IJ SOLN
10.0000 mg | Freq: Once | INTRAMUSCULAR | Status: AC
  Administered 2014-06-17: 10 mg via INTRAVENOUS
  Filled 2014-06-17: qty 2

## 2014-06-17 NOTE — ED Provider Notes (Signed)
CSN: [id number]     Arrival date & time 06/17/14  1022 History   First MD Initiated Contact with Patient 06/17/14 1032     Chief Complaint  Patient presents with  . Headache     (Consider location/radiation/quality/duration/timing/severity/associated sxs/prior Treatment) HPI  Patient reports he suffered a concussion when he was in the fifth grade. He describes getting frequent headaches that occur about 3 times a week. He states he normally takes Excedrin in the headaches go away. He states yesterday morning about 3:30 AM he started having a headache that was typical for his migraines. It starts at the back of his head and comes up into his temples with throbbing in his temples. He had nausea and vomiting twice today. He denies diarrhea or any visual changes. He has photophobia and noise sensitivity. He states he also feels like the room is spinning. He has some tingling in his left fingers. He states he has seen a neurologist before for his back but 96 head. He has however had a CAT scan of his head before. He states he took Excedrin today but it did not help his pain.   PCP none  Past Medical History  Diagnosis Date  . Asthma   Migraine headache   History reviewed. No pertinent past surgical history. History reviewed. No pertinent family history. History  Substance Use Topics  . Smoking status: Current Every Day Smoker -- 0.25 packs/day    Types: Cigarettes  . Smokeless tobacco: Not on file  . Alcohol Use: 0.6 oz/week    1 Cans of beer per week  employed Smokes 6 cigs a day Drinks 1 beer a week  Review of Systems  All other systems reviewed and are negative.     Allergies  Review of patient's allergies indicates no known allergies.  Home Medications   Prior to Admission medications   Medication Sig Start Date End Date Taking? Authorizing Provider  aspirin-acetaminophen-caffeine (EXCEDRIN MIGRAINE) 7241554109250-250-65 MG per tablet Take 2 tablets by mouth every 8 (eight)  hours as needed for headache.   Yes Historical Provider, MD  naproxen (NAPROSYN) 500 MG tablet Take 1 tablet (500 mg total) by mouth 2 (two) times daily with a meal. Patient not taking: Reported on 06/17/2014 04/07/14   Vida RollerBrian D Miller, MD   BP 136/92 mmHg  Pulse 63  Temp(Src) 97.5 F (36.4 C) (Oral)  Resp 18  Ht 6' (1.829 m)  Wt 164 lb (74.39 kg)  BMI 22.24 kg/m2  SpO2 100%  Vital signs normal    Physical Exam  Constitutional: He is oriented to person, place, and time. He appears well-developed and well-nourished.  Non-toxic appearance. He does not appear ill. No distress.  Appears painful, sitting in dark room with TV playing  HENT:  Head: Normocephalic and atraumatic.  Right Ear: External ear normal.  Left Ear: External ear normal.  Nose: Nose normal. No mucosal edema or rhinorrhea.  Mouth/Throat: Oropharynx is clear and moist and mucous membranes are normal. No dental abscesses or uvula swelling.  Eyes: Conjunctivae and EOM are normal. Pupils are equal, round, and reactive to light.  Neck: Normal range of motion and full passive range of motion without pain. Neck supple.  Cardiovascular: Normal rate, regular rhythm and normal heart sounds.  Exam reveals no gallop and no friction rub.   No murmur heard. Pulmonary/Chest: Effort normal and breath sounds normal. No respiratory distress. He has no wheezes. He has no rhonchi. He has no rales. He exhibits no tenderness and  no crepitus.  Abdominal: Soft. Normal appearance and bowel sounds are normal. He exhibits no distension. There is no tenderness. There is no rebound and no guarding.  Musculoskeletal: Normal range of motion. He exhibits no edema or tenderness.  Moves all extremities well.   Neurological: He is alert and oriented to person, place, and time. He has normal strength. No cranial nerve deficit.  Skin: Skin is warm, dry and intact. No rash noted. No erythema. No pallor.  Psychiatric: He has a normal mood and affect. His  speech is normal and behavior is normal. His mood appears not anxious.  Nursing note and vitals reviewed.   ED Course  Procedures (including critical care time)  Medications  sodium chloride 0.9 % bolus 1,000 mL (0 mLs Intravenous Stopped 06/17/14 1345)  diphenhydrAMINE (BENADRYL) injection 50 mg (50 mg Intravenous Given 06/17/14 1144)  metoCLOPramide (REGLAN) injection 10 mg (10 mg Intravenous Given 06/17/14 1145)  ketorolac (TORADOL) 30 MG/ML injection 30 mg (30 mg Intravenous Given 06/17/14 1143)  magnesium sulfate IVPB 2 g 50 mL (0 g Intravenous Stopped 06/17/14 1350)    Recheck 13:20 pt still having headache, only minimally improved.  Recheck 14:30 pt states his headache is gone, has been able to eat crackers and drink fluids. We discussed seeing a neurologist for his frequent headaches, he may benefit from prophylaxis.    Labs Review Labs Reviewed - No data to display  Imaging Review No results found.   EKG Interpretation None      MDM   Final diagnoses:  Migraine without aura and without status migrainosus, not intractable    Plan discharge  Devoria AlbeIva Alenah Sarria, MD, [facility]P     Forbes Loll L Ashlyne Olenick, MD 06/17/14 1455

## 2014-06-17 NOTE — ED Notes (Signed)
Pt has had ongoing headache since approximately 0300 this morning. Pt states the "lights make it hurt worse." Pt states he took 2 Excedrin tablets at home with no relief. Pt is alert and oriented.

## 2014-06-17 NOTE — Discharge Instructions (Signed)
Go home and rest. Consider being evaluated by a neurologist to discuss medications to help prevent your frequent headaches. Return to the ED if you feel worse again.    Migraine Headache A migraine headache is an intense, throbbing pain on one or both sides of your head. A migraine can last for 30 minutes to several hours. CAUSES  The exact cause of a migraine headache is not always known. However, a migraine may be caused when nerves in the brain become irritated and release chemicals that cause inflammation. This causes pain. Certain things may also trigger migraines, such as:  Alcohol.  Smoking.  Stress.  Menstruation.  Aged cheeses.  Foods or drinks that contain nitrates, glutamate, aspartame, or tyramine.  Lack of sleep.  Chocolate.  Caffeine.  Hunger.  Physical exertion.  Fatigue.  Medicines used to treat chest pain (nitroglycerine), birth control pills, estrogen, and some blood pressure medicines. SIGNS AND SYMPTOMS  Pain on one or both sides of your head.  Pulsating or throbbing pain.  Severe pain that prevents daily activities.  Pain that is aggravated by any physical activity.  Nausea, vomiting, or both.  Dizziness.  Pain with exposure to bright lights, loud noises, or activity.  General sensitivity to bright lights, loud noises, or smells. Before you get a migraine, you may get warning signs that a migraine is coming (aura). An aura may include:  Seeing flashing lights.  Seeing bright spots, halos, or zigzag lines.  Having tunnel vision or blurred vision.  Having feelings of numbness or tingling.  Having trouble talking.  Having muscle weakness. DIAGNOSIS  A migraine headache is often diagnosed based on:  Symptoms.  Physical exam.  A CT scan or MRI of your head. These imaging tests cannot diagnose migraines, but they can help rule out other causes of headaches. TREATMENT Medicines may be given for pain and nausea. Medicines can also  be given to help prevent recurrent migraines.  HOME CARE INSTRUCTIONS  Only take over-the-counter or prescription medicines for pain or discomfort as directed by your health care provider. The use of long-term narcotics is not recommended.  Lie down in a dark, quiet room when you have a migraine.  Keep a journal to find out what may trigger your migraine headaches. For example, write down:  What you eat and drink.  How much sleep you get.  Any change to your diet or medicines.  Limit alcohol consumption.  Quit smoking if you smoke.  Get 7-9 hours of sleep, or as recommended by your health care provider.  Limit stress.  Keep lights dim if bright lights bother you and make your migraines worse. SEEK IMMEDIATE MEDICAL CARE IF:   Your migraine becomes severe.  You have a fever.  You have a stiff neck.  You have vision loss.  You have muscular weakness or loss of muscle control.  You start losing your balance or have trouble walking.  You feel faint or pass out.  You have severe symptoms that are different from your first symptoms. MAKE SURE YOU:   Understand these instructions.  Will watch your condition.  Will get help right away if you are not doing well or get worse. Document Released: 07/25/2005 Document Revised: 12/09/2013 Document Reviewed: 04/01/2013 [facility] Patient Information 2015 [place], [place]. This information is not intended to replace advice given to you by your health care provider. Make sure you discuss any questions you have with your health care provider.

## 2014-06-26 NOTE — Progress Notes (Signed)
[facility] Specialist Spoke with patient regarding primary care resources and establishing care with a provider. Resource guide and my contact information provided for any future questions or concerns.

## 2014-08-28 ENCOUNTER — Emergency Department (HOSPITAL_COMMUNITY)
Admission: EM | Admit: 2014-08-28 | Discharge: 2014-08-29 | Disposition: A | Discharge status: Home / Self Care | Payer: Self-pay | Attending: Emergency Medicine | Admitting: Emergency Medicine

## 2014-08-28 ENCOUNTER — Encounter (HOSPITAL_COMMUNITY): Payer: Self-pay | Admitting: Emergency Medicine

## 2014-08-28 DIAGNOSIS — G44209 Tension-type headache, unspecified, not intractable: Secondary | ICD-10-CM | POA: Insufficient documentation

## 2014-08-28 DIAGNOSIS — J45909 Unspecified asthma, uncomplicated: Secondary | ICD-10-CM | POA: Insufficient documentation

## 2014-08-28 DIAGNOSIS — Z791 Long term (current) use of non-steroidal anti-inflammatories (NSAID): Secondary | ICD-10-CM | POA: Insufficient documentation

## 2014-08-28 DIAGNOSIS — Z720 Tobacco use: Secondary | ICD-10-CM | POA: Insufficient documentation

## 2014-08-28 LAB — CBG MONITORING, ED: Glucose-Capillary: 86 mg/dL (ref 70–99)

## 2014-08-28 MED ORDER — SODIUM CHLORIDE 0.9 % IV BOLUS (SEPSIS)
1000.0000 mL | Freq: Once | INTRAVENOUS | Status: AC
  Administered 2014-08-28: 1000 mL via INTRAVENOUS

## 2014-08-28 MED ORDER — KETOROLAC TROMETHAMINE 30 MG/ML IJ SOLN
30.0000 mg | Freq: Once | INTRAMUSCULAR | Status: AC
  Administered 2014-08-28: 30 mg via INTRAVENOUS
  Filled 2014-08-28: qty 1

## 2014-08-28 MED ORDER — METOCLOPRAMIDE HCL 5 MG/ML IJ SOLN
10.0000 mg | Freq: Once | INTRAMUSCULAR | Status: AC
  Administered 2014-08-28: 10 mg via INTRAVENOUS
  Filled 2014-08-28: qty 2

## 2014-08-28 MED ORDER — DIPHENHYDRAMINE HCL 50 MG/ML IJ SOLN
25.0000 mg | Freq: Once | INTRAMUSCULAR | Status: AC
  Administered 2014-08-28: 25 mg via INTRAVENOUS
  Filled 2014-08-28: qty 1

## 2014-08-28 NOTE — ED Notes (Signed)
Pt placed into gown and on monitor upon arrival to room. Pt monitored by blood pressure, pulse ox, and 5 lead.  

## 2014-08-28 NOTE — ED Notes (Signed)
Pt. reports headache , dizziness, near syncope at work this evening , and mild blurred vision , no nausea or vomitting .

## 2014-08-28 NOTE — ED Provider Notes (Signed)
CSN: [id number]     Arrival date & time 08/28/14  2104 History   First MD Initiated Contact with Patient 08/28/14 2121     Chief Complaint  Patient presents with  . Headache     (Consider location/radiation/quality/duration/timing/severity/associated sxs/prior Treatment) Patient is a 29 y.o. male presenting with headaches.  Headache Pain location:  Frontal and R temporal Quality: thorbbing. Radiates to:  Does not radiate Severity currently:  4/10 Severity at highest:  8/10 Onset quality:  Gradual Duration:  1 day Timing:  Constant Progression:  Waxing and waning Chronicity:  New Context comment:  Pt was doing non strenuous acitivity at work Relieved by:  Nothing Worsened by:  Nothing tried Ineffective treatments:  None tried Associated symptoms: blurred vision (mild) and dizziness   Associated symptoms: no back pain, no congestion, no cough, no near-syncope, no sinus pressure, no sore throat, no syncope, no tingling and no URI     Past Medical History  Diagnosis Date  . Asthma    History reviewed. No pertinent past surgical history. No family history on file. History  Substance Use Topics  . Smoking status: Current Every Day Smoker -- 0.25 packs/day    Types: Cigarettes  . Smokeless tobacco: Not on file  . Alcohol Use: 0.6 oz/week    1 Cans of beer per week    Review of Systems  HENT: Negative for congestion, sinus pressure and sore throat.   Eyes: Positive for blurred vision (mild).  Respiratory: Negative for cough.   Cardiovascular: Negative for syncope and near-syncope.  Musculoskeletal: Negative for back pain.  Neurological: Positive for dizziness and headaches.    10 Systems reviewed and are negative for acute change except as noted in the HPI.    Allergies  Review of patient's allergies indicates no known allergies.  Home Medications   Prior to Admission medications   Medication Sig Start Date End Date Taking? Authorizing Provider   aspirin-acetaminophen-caffeine (EXCEDRIN MIGRAINE) [phone] MG per tablet Take 2 tablets by mouth every 8 (eight) hours as needed for headache.   Yes Historical Provider, MD  naproxen (NAPROSYN) 500 MG tablet Take 1 tablet (500 mg total) by mouth 2 (two) times daily with a meal. Patient not taking: Reported on 06/17/2014 04/07/14   Vida Roller, MD  ondansetron (ZOFRAN) 4 MG tablet Take 1 tablet (4 mg total) by mouth every 6 (six) hours. 08/29/14   Malie Kashani Irine Seal, PA-C  traMADol (ULTRAM) 50 MG tablet Take 1 tablet (50 mg total) by mouth every 6 (six) hours as needed. 08/29/14   Zetta Stoneman Irine Seal, PA-C   BP 124/85 mmHg  Pulse 68  Temp(Src) 97.9 F (36.6 C) (Oral)  Resp 20  Ht 6' (1.829 m)  Wt 160 lb (72.576 kg)  BMI 21.70 kg/m2  SpO2 98% Physical Exam  Constitutional: He appears well-developed and well-nourished. No distress.  HENT:  Head: Normocephalic and atraumatic.  Eyes: Pupils are equal, round, and reactive to light.  Neck: Normal range of motion. Neck supple. No spinous process tenderness and no muscular tenderness present. No Brudzinski's sign and no Kernig's sign noted.  Cardiovascular: Normal rate and regular rhythm.   Pulmonary/Chest: Effort normal.  Abdominal: Soft.  Neurological: He is alert.  Cranial nerves II-VIII and X-XII evaluated and show no deficits. Pt alert and oriented x 3 Upper and lower extremity strength is symmetrical and physiologic Normal muscular tone No facial droop Coordination intact, no limb ataxia, finger-nose-finger normal Rapid alternating movements normal No pronator drift  Skin: Skin is warm and dry.  Nursing note and vitals reviewed.   ED Course  Procedures (including critical care time) Labs Review Labs Reviewed  CBG MONITORING, ED    Imaging Review No results found.   EKG Interpretation None      MDM   Final diagnoses:  Tension-type headache, not intractable, unspecified chronicity pattern    Medications  sodium  chloride 0.9 % bolus 1,000 mL (0 mLs Intravenous Stopped 08/28/14 2347)  diphenhydrAMINE (BENADRYL) injection 25 mg (25 mg Intravenous Given 08/28/14 2232)  metoCLOPramide (REGLAN) injection 10 mg (10 mg Intravenous Given 08/28/14 2232)  ketorolac (TORADOL) 30 MG/ML injection 30 mg (30 mg Intravenous Given 08/28/14 2346)    The patients pain easily resolved with IV pain medication and fluids. The patient denies any symptoms of neurological impairment or TIA's; no amaurosis, diplopia, dysphasia, or unilateral disturbance of motor or sensory function. No loss of balance or vertigo.  29 y.o.Rafi L Huckins's evaluation in the Emergency Department is complete. It has been determined that no acute conditions requiring further emergency intervention are present at this time. The patient/guardian have been advised of the diagnosis and plan. We have discussed signs and symptoms that warrant return to the ED, such as changes or worsening in symptoms.  Vital signs are stable at discharge. Filed Vitals:   08/29/14 0030  BP: 124/85  Pulse: 68  Temp:   Resp: 20    Patient/guardian has voiced understanding and agreed to follow-up with the PCP or specialist.     Dorthula Matasiffany G Berniece Abid, PA-C 09/02/14 1620  Toy BakerAnthony T Allen, MD 09/05/14 [phone]

## 2014-08-28 NOTE — ED Notes (Signed)
CBG = 86 Reported to RN

## 2014-08-28 NOTE — ED Notes (Signed)
Pt was able to complete orthostatic vital signs with no assistance needed with position changes. Pt did state he felt dizzy while standing.

## 2014-08-29 MED ORDER — TRAMADOL HCL 50 MG PO TABS: 50.0000 mg | ORAL_TABLET | Freq: Four times a day (QID) | ORAL | Status: DC | PRN

## 2014-08-29 MED ORDER — ONDANSETRON HCL 4 MG PO TABS: 4.0000 mg | ORAL_TABLET | Freq: Four times a day (QID) | ORAL | Status: DC

## 2014-08-29 NOTE — Discharge Instructions (Signed)

## 2015-07-25 ENCOUNTER — Encounter (HOSPITAL_COMMUNITY): Payer: Self-pay | Admitting: Emergency Medicine

## 2015-07-25 ENCOUNTER — Emergency Department (HOSPITAL_COMMUNITY)
Admission: EM | Admit: 2015-07-25 | Discharge: 2015-07-25 | Disposition: A | Discharge status: Home / Self Care | Payer: Self-pay | Attending: Emergency Medicine | Admitting: Emergency Medicine

## 2015-07-25 ENCOUNTER — Emergency Department (HOSPITAL_COMMUNITY): Payer: Self-pay

## 2015-07-25 DIAGNOSIS — K0889 Other specified disorders of teeth and supporting structures: Secondary | ICD-10-CM | POA: Insufficient documentation

## 2015-07-25 DIAGNOSIS — K089 Disorder of teeth and supporting structures, unspecified: Secondary | ICD-10-CM

## 2015-07-25 DIAGNOSIS — F17210 Nicotine dependence, cigarettes, uncomplicated: Secondary | ICD-10-CM | POA: Insufficient documentation

## 2015-07-25 DIAGNOSIS — G8929 Other chronic pain: Secondary | ICD-10-CM | POA: Insufficient documentation

## 2015-07-25 DIAGNOSIS — J45909 Unspecified asthma, uncomplicated: Secondary | ICD-10-CM | POA: Insufficient documentation

## 2015-07-25 DIAGNOSIS — K602 Anal fissure, unspecified: Secondary | ICD-10-CM | POA: Insufficient documentation

## 2015-07-25 DIAGNOSIS — R197 Diarrhea, unspecified: Secondary | ICD-10-CM | POA: Insufficient documentation

## 2015-07-25 HISTORY — DX: Headache: R51

## 2015-07-25 HISTORY — DX: Headache, unspecified: R51.9

## 2015-07-25 LAB — CBC WITH DIFFERENTIAL/PLATELET
BASOS ABS: 0.1 10*3/uL (ref 0.0–0.1)
Basophils Relative: 1 %
EOS ABS: 0 10*3/uL (ref 0.0–0.7)
Eosinophils Relative: 0 %
HCT: 39.3 % (ref 39.0–52.0)
Hemoglobin: 13.4 g/dL (ref 13.0–17.0)
LYMPHS PCT: 14 %
Lymphs Abs: 1.4 10*3/uL (ref 0.7–4.0)
MCH: 29.5 pg (ref 26.0–34.0)
MCHC: 34.1 g/dL (ref 30.0–36.0)
MCV: 86.6 fL (ref 78.0–100.0)
Monocytes Absolute: 1.1 10*3/uL — ABNORMAL HIGH (ref 0.1–1.0)
Monocytes Relative: 11 %
Neutro Abs: 7.5 10*3/uL (ref 1.7–7.7)
Neutrophils Relative %: 74 %
Platelets: 179 10*3/uL (ref 150–400)
RBC: 4.54 MIL/uL (ref 4.22–5.81)
RDW: 13.9 % (ref 11.5–15.5)
WBC: 10.1 10*3/uL (ref 4.0–10.5)

## 2015-07-25 LAB — COMPREHENSIVE METABOLIC PANEL
ALT: 13 U/L — ABNORMAL LOW (ref 17–63)
AST: 17 U/L (ref 15–41)
Albumin: 4.2 g/dL (ref 3.5–5.0)
Alkaline Phosphatase: 51 U/L (ref 38–126)
Anion gap: 10 (ref 5–15)
BILIRUBIN TOTAL: 0.8 mg/dL (ref 0.3–1.2)
BUN: 8 mg/dL (ref 6–20)
CO2: 26 mmol/L (ref 22–32)
Calcium: 9.2 mg/dL (ref 8.9–10.3)
Chloride: 105 mmol/L (ref 101–111)
Creatinine, Ser: 1.35 mg/dL — ABNORMAL HIGH (ref 0.61–1.24)
GFR calc Af Amer: >60 mL/min (ref >60)
Glucose, Bld: 85 mg/dL (ref 65–99)
POTASSIUM: 3.8 mmol/L (ref 3.5–5.1)
Sodium: 141 mmol/L (ref 135–145)
TOTAL PROTEIN: 6.8 g/dL (ref 6.5–8.1)

## 2015-07-25 LAB — LIPASE, BLOOD: Lipase: 23 U/L (ref 11–51)

## 2015-07-25 MED ORDER — ACETAMINOPHEN 325 MG PO TABS
650.0000 mg | ORAL_TABLET | Freq: Once | ORAL | Status: AC
  Administered 2015-07-25: 650 mg via ORAL

## 2015-07-25 MED ORDER — ACETAMINOPHEN 325 MG PO TABS
ORAL_TABLET | ORAL | Status: AC
  Filled 2015-07-25: qty 2

## 2015-07-25 NOTE — ED Notes (Signed)
Pt with bright red blood in stool, little ongoing Tuesday evening but today more blood noted on toilet paper and in toilet

## 2015-07-25 NOTE — ED Provider Notes (Signed)
CSN: [id number]     Arrival date & time 07/25/15  1722 History   First MD Initiated Contact with Patient 07/25/15 1731     Chief Complaint  Patient presents with  . Rectal Bleeding  . Diarrhea      HPI Pt was seen at 1740. Per pt, c/o gradual onset and persistence of multiple intermittent episodes of "rectal bleeding" for the past 4 days. Pt describes his symptoms as "seeing red blood on the toilet paper" after he wipes after having a BM. States he has had several episodes of "green" and "watery" diarrhea for the past 2 weeks. Denies N/V, no abd pain, no fevers, no back pain, no black stools, no rash.    Past Medical History  Diagnosis Date  . Asthma   . Headache    History reviewed. No pertinent past surgical history.  Social History  Substance Use Topics  . Smoking status: Current Every Day Smoker -- 0.25 packs/day    Types: Cigarettes  . Smokeless tobacco: None  . Alcohol Use: 0.6 oz/week    1 Cans of beer per week    Review of Systems ROS: Statement: All systems negative except as marked or noted in the HPI; Constitutional: Negative for fever and chills. ; ; Eyes: Negative for eye pain, redness and discharge. ; ; ENMT: Negative for ear pain, hoarseness, nasal congestion, sinus pressure and sore throat. ; ; Cardiovascular: Negative for chest pain, palpitations, diaphoresis, dyspnea and peripheral edema. ; ; Respiratory: Negative for cough, wheezing and stridor. ; ; Gastrointestinal: Negative for nausea, vomiting, abdominal pain, blood in stool, hematemesis, jaundice and +diarrhea, +rectal bleeding. . ; ; Genitourinary: Negative for dysuria, flank pain and hematuria. ; ; Musculoskeletal: Negative for back pain and neck pain. Negative for swelling and trauma.; ; Skin: Negative for pruritus, rash, abrasions, blisters, bruising and skin lesion.; ; Neuro: Negative for headache, lightheadedness and neck stiffness. Negative for weakness, altered level of consciousness , altered mental  status, extremity weakness, paresthesias, involuntary movement, seizure and syncope.      Allergies  Review of patient's allergies indicates no known allergies.  Home Medications   Prior to Admission medications   Medication Sig Start Date End Date Taking? Authorizing Provider  naproxen (NAPROSYN) 500 MG tablet Take 1 tablet (500 mg total) by mouth 2 (two) times daily with a meal. Patient not taking: Reported on 06/17/2014 04/07/14   Eber Hong, MD  ondansetron (ZOFRAN) 4 MG tablet Take 1 tablet (4 mg total) by mouth every 6 (six) hours. 08/29/14   Tiffany Neva Seat, PA-C  traMADol (ULTRAM) 50 MG tablet Take 1 tablet (50 mg total) by mouth every 6 (six) hours as needed. 08/29/14   Tiffany Neva Seat, PA-C   BP 153/98 mmHg  Pulse 91  Temp(Src) 98.4 F (36.9 C) (Oral)  Resp 18  Ht 6' (1.829 m)  Wt 163 lb (73.936 kg)  BMI 22.10 kg/m2  SpO2 100% Physical Exam  1745: Physical examination:  Nursing notes reviewed; Vital signs and O2 SAT reviewed;  Constitutional: Well developed, Well nourished, Well hydrated, In no acute distress; Head:  Normocephalic, atraumatic; Eyes: EOMI, PERRL, No scleral icterus; ENMT: Mouth and pharynx normal, Mucous membranes moist; Neck: Supple, Full range of motion, No lymphadenopathy; Cardiovascular: Regular rate and rhythm, No murmur, rub, or gallop; Respiratory: Breath sounds clear & equal bilaterally, No rales, rhonchi, wheezes.  Speaking full sentences with ease, Normal respiratory effort/excursion; Chest: Nontender, Movement normal; Abdomen: Soft, Nontender, Nondistended, Normal bowel sounds. Rectal exam performed w/permission  of pt and ED RN chaperone present.  Anal tone normal.  +small fissure without active bleeding. No external hemorrhoids, no palp masses.; Genitourinary: No CVA tenderness; Extremities: Pulses normal, No tenderness, No edema, No calf edema or asymmetry.; Neuro: AA&Ox3, Major CN grossly intact.  Speech clear. No gross focal motor or sensory deficits  in extremities.; Skin: Color normal, Warm, Dry.   ED Course  Procedures (including critical care time) Labs Review   Imaging Review  I have personally reviewed and evaluated these images and lab results as part of my medical decision-making.   EKG Interpretation None      MDM  MDM Reviewed: previous chart, nursing note and vitals Reviewed previous: labs Interpretation: labs and x-ray     Results for orders placed or performed during the hospital encounter of 07/25/15  Comprehensive metabolic panel  Result Value Ref Range   Sodium 141 135 - 145 mmol/L   Potassium 3.8 3.5 - 5.1 mmol/L   Chloride 105 101 - 111 mmol/L   CO2 26 22 - 32 mmol/L   Glucose, Bld 85 65 - 99 mg/dL   BUN 8 6 - 20 mg/dL   Creatinine, Ser 1.61 (H) 0.61 - 1.24 mg/dL   Calcium 9.2 8.9 - 09.6 mg/dL   Total Protein 6.8 6.5 - 8.1 g/dL   Albumin 4.2 3.5 - 5.0 g/dL   AST 17 15 - 41 U/L   ALT 13 (L) 17 - 63 U/L   Alkaline Phosphatase 51 38 - 126 U/L   Total Bilirubin 0.8 0.3 - 1.2 mg/dL   GFR calc non Af Amer >60 >60 mL/min   GFR calc Af Amer >60 >60 mL/min   Anion gap 10 5 - 15  CBC with Differential  Result Value Ref Range   WBC 10.1 4.0 - 10.5 K/uL   RBC 4.54 4.22 - 5.81 MIL/uL   Hemoglobin 13.4 13.0 - 17.0 g/dL   HCT 04.5 40.9 - 81.1 %   MCV 86.6 78.0 - 100.0 fL   MCH 29.5 26.0 - 34.0 pg   MCHC 34.1 30.0 - 36.0 g/dL   RDW 91.4 78.2 - 95.6 %   Platelets 179 150 - 400 K/uL   Neutrophils Relative % 74 %   Neutro Abs 7.5 1.7 - 7.7 K/uL   Lymphocytes Relative 14 %   Lymphs Abs 1.4 0.7 - 4.0 K/uL   Monocytes Relative 11 %   Monocytes Absolute 1.1 (H) 0.1 - 1.0 K/uL   Eosinophils Relative 0 %   Eosinophils Absolute 0.0 0.0 - 0.7 K/uL   Basophils Relative 1 %   Basophils Absolute 0.1 0.0 - 0.1 K/uL  Lipase, blood  Result Value Ref Range   Lipase 23 11 - 51 U/L   Dg Abd Acute W/chest 07/25/2015  CLINICAL DATA:  Diarrhea for 3 weeks. EXAM: DG ABDOMEN ACUTE W/ 1V CHEST COMPARISON:  None.  FINDINGS: The cardiomediastinal contours are normal. The lungs are clear. There is no free intra-abdominal air. No dilated bowel loops to suggest obstruction. Small volume of stool throughout the colon. No radiopaque calculi. No acute osseous abnormalities are seen. IMPRESSION: Nonobstructive bowel gas pattern.  No free air. Electronically Signed   By: Rubye Oaks M.D.   On: 07/25/2015 19:03    1935:  Pt has tol PO well while in the ED without N/V.  No stooling while in the ED.  Abd remains benign, VSS. Feels better and wants to go home now. Pt now requesting "some pain medicine" for "  bad teeth" he has "had for a while" that he "needs a root canal on."  ENMT: Mouth and pharynx normal, Poor dentition, Widespread dental decay, Left TM normal, Right TM normal, Mucous membranes moist, +upper and lower left 2nd molars with dental decay.  No gingival erythema, edema, fluctuance, or drainage.  No intra-oral edema. No submandibular or sublingual edema. No hoarse voice, no drooling, no stridor. No trismus. Pt encouraged to f/u with dentist or oral surgeon for his dental needs for good continuity of care and definitive treatment.  Pt verb understanding. Dx and testing d/w pt and family.  Questions answered.  Verb understanding, agreeable to d/c home with outpt f/u.     Samuel JesterKathleen Emilliano Dilworth, DO 07/28/15 2029

## 2015-07-25 NOTE — Discharge Instructions (Signed)
°Emergency Department Resource Guide °1) Find a Doctor and Pay Out of Pocket °Although you won't have to find out who is covered by your insurance plan, it is a good idea to ask around and get recommendations. You will then need to call the office and see if the doctor you have chosen will accept you as a new patient and what types of options they offer for patients who are self-pay. Some doctors offer discounts or will set up payment plans for their patients who do not have insurance, but you will need to ask so you aren't surprised when you get to your appointment. ° °2) Contact Your Local Health Department °Not all health departments have doctors that can see patients for sick visits, but many do, so it is worth a call to see if yours does. If you don't know where your local health department is, you can check in your phone book. The CDC also has a tool to help you locate your state's health department, and many state websites also have listings of all of their local health departments. ° °3) Find a Walk-in Clinic °If your illness is not likely to be very severe or complicated, you may want to try a walk in clinic. These are popping up all over the country in pharmacies, drugstores, and shopping centers. They're usually staffed by nurse practitioners or physician assistants that have been trained to treat common illnesses and complaints. They're usually fairly quick and inexpensive. However, if you have serious medical issues or chronic medical problems, these are probably not your best option. ° °No Primary Care Doctor: °- Call Health Connect at  832-8000 - they can help you locate a primary care doctor that  accepts your insurance, provides certain services, etc. °- Physician Referral Service- 1-800-533-3463 ° °Chronic Pain Problems: °Organization         Address  Phone   Notes  °[facility]  (336) 297-2271 Patients need to be referred by their primary care doctor.  ° °Medication  Assistance: °Organization         Address  Phone   Notes  °[place] [facility] [address] °[place], [place] [address] (336) 641-8030 --Must be a resident of [place] °-- Must have NO insurance coverage whatsoever (no Medicaid/ Medicare, etc.) °-- The pt. MUST have a primary care doctor that directs their care regularly and follows them in the community °  °[facility]  (866) 331-1348   °[employer]  (888) 892-1162   ° °Agencies that provide inexpensive medical care: °Organization         Address  Phone   Notes  °[facility]  (336) 832-8035   °[facility]    (336) 832-7272   °[facility] [address] °[place], [place] [address] (336) 832-4777   °[facility] [address], °[place] (336) 271-4999   °[facility]    (336) 373-0678   °[facility]    (336) 272-1050   °[facility] ° [address], [place] Phone:  (336) 832-4444, Fax:  (336) 832-4440 Hours of Operation:  9 am - 6 pm, M-F.  Also accepts Medicaid/Medicare and self-pay.  °[facility] ° [address], [place] Phone: (336) 832-3150, Fax: (336) [phone]. Hours of Operation:  8:30 am - 5:30 pm, M-F.  Also accepts Medicaid and self-pay.  °[facility] [place] [address], [place] Phone: (336) 878-6027   °[facility] [address], [place], [place] (336)723-1848, Ext. 123 Mondays & Thursdays: 7-9 AM.  First 15 patients are seen on a first come, first serve basis. °  ° °Medicaid-accepting [place] Providers: ° °Organization         Address  Phone   Notes  °[facility] [address], [place] (336) 641-2100 Also accepts self-pay patients.  °[facility] [address], [place] ° (336) 856-9996   °[facility] [address], [place]  (336) 288-8857   °[facility] [address] [place] [address], [place] (336) 299-7000   °Veita Bland [address], [place]  ° (336) 373-1557 Only accepts [place] Access Medicaid patients after they have their name applied to their card.  ° °Self-Pay (no insurance) in [place]: ° °Organization         Address  Phone   Notes  °Sickle Cell Patients, [facility] [address], [place] (336) 832-1970   °[facility] [address], [place] (336) 832-4400   °[facility] ° [address], [place] (336) 992-4800   °[facility]/Dr. Osei-Bonsu ° [address] [place] [address], [place] or [address], [place] (336) 841-8500 Phone number for both [place] and [place] locations is the same.  °[facility] [address], [place] (336) 299-0000   °[facility] [address] [place] [address], [place] or [address] (336) 852-7530 °(336) 878-2260   °[facility] [address], [place] (336) 350-1642, phone; (336) 294-5005, fax Sees patients 1st and 3rd Saturday of every month.  Must not qualify for public or private insurance (i.e. Medicaid, Medicare, [place] Health Choice, Veterans' Benefits) • Household income should be no more than 200% of the poverty level •The clinic cannot treat you if you are pregnant or think you are pregnant • Sexually transmitted diseases are not treated at the clinic.  ° ° °Dental Care: °Organization         Address  Phone  Notes  °[place] [facility] [address], [place] (336) 641-6152 Accepts children up to age 21 who are enrolled in Medicaid or [place] Health Choice; pregnant women with a Medicaid card; and children who have applied for Medicaid or [place] Health Choice, but were declined, whose parents can pay a reduced fee at time of service.  °[place]  [facility] [place]  [address], [place] (336) 641-7733 Accepts children up to age 21 who are enrolled in Medicaid or [place] Health Choice; pregnant women with a Medicaid card; and children who have applied for Medicaid or [place] Health Choice, but were declined, whose parents can pay a reduced fee at time of service.  °[facility] ° [address], [place] (336) 641-4533 Patients are seen by appointment only. Walk-ins are not accepted. [facility] will see patients 18 years of age and older. °Monday - Tuesday (8am-5pm) °Most Wednesdays (8:30-5pm) °$30 per visit, cash only  °[facility] ° [address], [place] (336) 641-4533 Patients are seen by appointment only. Walk-ins are not accepted. [facility] will see patients 18 years of age and older. °One   Wednesday Evening (Monthly: Volunteer Based).  $30 per visit, cash only  °[facility]  (919) 537-3737 for adults; Children under age 4, call Graduate Pediatric Dentistry at ([phone]) [phone]. Children aged 4-14, please call (919) [phone] to request a pediatric application. ° Dental services are provided in all areas of dental care including fillings, crowns and bridges, complete and partial dentures, implants, gum treatment, root canals, and extractions. Preventive care is also provided. Treatment is provided to both adults and children. °Patients are selected via a lottery and there is often a waiting list. °  °[facility] [address], °[place] ° (336) 763-8833 [email or web address] °  °[facility] [address], [place], [place] (336)723-1848, Ext. 123 Second and Fourth Thursday of each month, opens at 6:30 AM; Clinic ends at 9 AM.  Patients are seen on a first-come first-served basis, and a limited number are seen during each clinic.  ° °[facility] ° [address], [place], [place] (336) 723-7904    Eligibility Requirements °You must have lived in [place], [place], or [place] counties for at least the last three months. °  You cannot be eligible for state or federal sponsored healthcare insurance, including Veterans Administration, Medicaid, or Medicare. °  You generally cannot be eligible for healthcare insurance through your employer.  °  How to apply: °Eligibility screenings are held every Tuesday and Wednesday afternoon from 1:00 pm until 4:00 pm. You do not need an appointment for the interview!  °[facility] [address], [place], [place] 336-631-2330   °[facility]  336-342-8273   °[facility]  336-703-3100   °[facility]  336-570-6415   ° °Behavioral Health Resources in the Community: °Intensive Outpatient Programs °Organization         Address  Phone  Notes  °[facility] [address], [place], [place] 336-878-6098   °[facility] [address], [place], [place] 336-832-9800   °[facility] [address], [place], [place] ° 336-882-2125   °[facility] [address],  °[place], [place] 1-800-853-5163 or 336-641-4981   °Substance Abuse Resources °Organization         Address  Phone  Notes  °[facility]  336-882-2125   °[facility]  336-784-9470   °[facility]  336-285-9073   °[facility]  336-845-3988   °Residential & Outpatient Substance Abuse Program  1-800-659-3381   °Psychological Services °Organization         Address  Phone  Notes  °[facility]  336- 832-9600   °[facility]  336- 378-7881   °[facility] [address], [place] 1-800-853-5163 or 336-641-4981   ° °Mobile Crisis Teams °Organization         Address  Phone  Notes  °[facility], Mobile Crisis Care Unit  1-877-626-1772   °Assertive °[facility] ° [address]  [place], [place] 336-834-9664   °Sharon DeEsch [address] °[place] 336-554-5454   ° °Self-Help/Support Groups °Organization         Address  Phone             Notes  °[facility]oc[facility] of  - variety of support groups  336- 373-1402 Call for more information  °Narcotics Anonymous (NA), [facility] [address], °[place]  2 meetings at this location  ° °  Residential Treatment Programs Organization         Address  Phone  Notes  [facility] [address],    [place]  [phone])[phone]   [facility]  [address], [place] [phone], [place], [place] [phone]   [facility] [address], [place] [phone] Admissions: 8am-3pm M-F  [facility] [address]    [place], [place] [phone] [phone]   [facility] [address], [place], [place] [phone]   [facility] [address]  [place], [place] [phone]   [facility] Intensive Outpatient [address], [place], [place] [phone]   [facility] ([facility] [address]  [place], [place] [phone] or [phone]   [facility] ([facility]) [address] [place], [place] [phone] Accepts Medicaid  [facility] [address]  [place] [phone] Substance Abuse/Addiction Treatment   [facility] Organization         Address  Phone  Notes  [facility]  514-202-4444(888) (248)[phone]   Angie FavaJulie Brannon, PhD [address] [place], [place]   [phone](336) (703)[phone] or ([phone](336) ([phone]) [phone]   [facility]   [address] [place], [place] [phone](336) [phone]   [facility] [address], [place], [place] ([phone](336) [phone] Insurance/Medicaid/sponsorship through [facility]  [facility] [address]                                    [place], [place] [phone](336) [phone] Therapy/tele-psych/case    [facility] [address]   [place], [place] [phone](336) [phone]    Dr. Lolly MustacheArfeen  [phone](336) [phone]   [facility]  [employer] [facility] 1) [address], [place] 2) [address], [place] 3)  [address], [place] [phone](336) [phone](336) [phone]  [phone](336) [phone]   [facility] Child Abuse Hotline (310) 438-0651(336) (509)147-1798 or 743-404-0021(336) [phone] (After Hours)      Take over the counter tylenol, as directed on packaging, as needed for discomfort.  Increase your fluid intake (ie:  Gatoraide) for the next few days.   Eat a bland diet and advance to your regular diet slowly as you can tolerate it.   Avoid full strength juices, as well as milk and milk products until your diarrhea has resolved.   Call your regular medical doctor, the Dentist, and the GI doctor on Monday to schedule a follow up appointment this week.  Return to the Emergency Department immediately sooner if worsening.

## 2017-06-13 ENCOUNTER — Emergency Department (HOSPITAL_COMMUNITY)
Admission: EM | Admit: 2017-06-13 | Discharge: 2017-06-14 | Disposition: A | Discharge status: Home / Self Care | Payer: Self-pay | Attending: Emergency Medicine | Admitting: Emergency Medicine

## 2017-06-13 ENCOUNTER — Encounter (HOSPITAL_COMMUNITY): Payer: Self-pay

## 2017-06-13 DIAGNOSIS — J45909 Unspecified asthma, uncomplicated: Secondary | ICD-10-CM | POA: Insufficient documentation

## 2017-06-13 DIAGNOSIS — R55 Syncope and collapse: Secondary | ICD-10-CM

## 2017-06-13 DIAGNOSIS — G43009 Migraine without aura, not intractable, without status migrainosus: Secondary | ICD-10-CM | POA: Insufficient documentation

## 2017-06-13 DIAGNOSIS — F17210 Nicotine dependence, cigarettes, uncomplicated: Secondary | ICD-10-CM | POA: Insufficient documentation

## 2017-06-13 LAB — CBC
HEMATOCRIT: 38.4 % — AB (ref 39.0–52.0)
Hemoglobin: 12.7 g/dL — ABNORMAL LOW (ref 13.0–17.0)
MCH: 28.7 pg (ref 26.0–34.0)
MCHC: 33.1 g/dL (ref 30.0–36.0)
MCV: 86.9 fL (ref 78.0–100.0)
PLATELETS: 207 10*3/uL (ref 150–400)
RBC: 4.42 MIL/uL (ref 4.22–5.81)
RDW: 13.8 % (ref 11.5–15.5)
WBC: 8.1 10*3/uL (ref 4.0–10.5)

## 2017-06-13 LAB — URINALYSIS, ROUTINE W REFLEX MICROSCOPIC
BILIRUBIN URINE: NEGATIVE
Glucose, UA: NEGATIVE mg/dL
Hgb urine dipstick: NEGATIVE
KETONES UR: NEGATIVE mg/dL
LEUKOCYTES UA: NEGATIVE
NITRITE: NEGATIVE
PROTEIN: NEGATIVE mg/dL
Specific Gravity, Urine: 1.016 (ref 1.005–1.030)
pH: 6 (ref 5.0–8.0)

## 2017-06-13 LAB — BASIC METABOLIC PANEL
Anion gap: 7 (ref 5–15)
BUN: 8 mg/dL (ref 6–20)
CALCIUM: 8.7 mg/dL — AB (ref 8.9–10.3)
CO2: 26 mmol/L (ref 22–32)
CREATININE: 1.27 mg/dL — AB (ref 0.61–1.24)
Chloride: 105 mmol/L (ref 101–111)
GFR calc Af Amer: >60 mL/min (ref >60)
Glucose, Bld: 97 mg/dL (ref 65–99)
POTASSIUM: 3.4 mmol/L — AB (ref 3.5–5.1)
SODIUM: 138 mmol/L (ref 135–145)

## 2017-06-13 MED ORDER — SODIUM CHLORIDE 0.9 % IV BOLUS (SEPSIS)
1000.0000 mL | Freq: Once | INTRAVENOUS | Status: AC
  Administered 2017-06-14: 1000 mL via INTRAVENOUS

## 2017-06-13 NOTE — ED Triage Notes (Signed)
Pt states that he had a syncopal episode today after work, unknown if he hit his head, c/o of headache and neck pain. Pt states that he has had a migraine for the past three days, vomited x 1

## 2017-06-14 MED ORDER — DIPHENHYDRAMINE HCL 50 MG/ML IJ SOLN
25.0000 mg | Freq: Once | INTRAMUSCULAR | Status: AC
  Administered 2017-06-14: 25 mg via INTRAVENOUS
  Filled 2017-06-14: qty 1

## 2017-06-14 MED ORDER — PROCHLORPERAZINE EDISYLATE 5 MG/ML IJ SOLN
10.0000 mg | Freq: Once | INTRAMUSCULAR | Status: AC
  Administered 2017-06-14: 10 mg via INTRAVENOUS
  Filled 2017-06-14: qty 2

## 2017-06-14 NOTE — ED Provider Notes (Signed)
[facility] Provider Note   CSN: [id number] Arrival date & time: 06/13/17  2239     History   Chief Complaint Chief Complaint  Patient presents with  . Loss of Consciousness    HPI Jon Nolan is a 31 y.o. male.  HPI  This is a 31 year old male with a history of migraines who presents with headache and syncope.  Patient reports 3-day history of headache consistent with his prior migraines.  He has taken ibuprofen with minimal relief.  Reports photophobia.  No weakness, numbness, tingling, vision changes.  Currently he rates his pain at 9 out of 10.  States that he got off work and was walking to his car.  He felt very lightheaded.  He states next thing he knew he was on the ground.  He reports left neck pain.  Denies any vomiting.  Unsure if he hit his head.  Denies any chest pain or shortness of breath.  No early family history of sudden death.  Does report history of vasovagal syncope with blood draw.  Past Medical History:  Diagnosis Date  . Asthma   . Headache     There are no active problems to display for this patient.   History reviewed. No pertinent surgical history.     Home Medications    Prior to Admission medications   Medication Sig Start Date End Date Taking? Authorizing Provider  naproxen (NAPROSYN) 500 MG tablet Take 1 tablet (500 mg total) by mouth 2 (two) times daily with a meal. Patient not taking: Reported on 06/17/2014 04/07/14   Eber HongMiller, Brian, MD  ondansetron (ZOFRAN) 4 MG tablet Take 1 tablet (4 mg total) by mouth every 6 (six) hours. Patient not taking: Reported on 06/14/2017 08/29/14   Marlon PelGreene, Tiffany, PA-C  traMADol (ULTRAM) 50 MG tablet Take 1 tablet (50 mg total) by mouth every 6 (six) hours as needed. Patient not taking: Reported on 06/14/2017 08/29/14   Marlon PelGreene, Tiffany, PA-C    Family History No family history on file.  Social History Social History   Tobacco Use  . Smoking status: Current  Every Day Smoker    Packs/day: 0.25    Types: Cigarettes  . Smokeless tobacco: Never Used  Substance Use Topics  . Alcohol use: Yes    Alcohol/week: 0.6 oz    Types: 1 Cans of beer per week  . Drug use: No     Allergies   Patient has no known allergies.   Review of Systems Review of Systems  Constitutional: Negative for fever.  Eyes: Positive for photophobia. Negative for visual disturbance.  Respiratory: Negative for shortness of breath.   Cardiovascular: Negative for chest pain.  Gastrointestinal: Negative for abdominal pain, nausea and vomiting.  Neurological: Positive for dizziness, syncope and headaches. Negative for weakness.  All other systems reviewed and are negative.    Physical Exam Updated Vital Signs BP (!) 159/107   Pulse 64   Temp (!) 97.3 F (36.3 C) (Oral)   Resp 20   Ht 5\' 11"  (1.803 m)   Wt 74.8 kg (165 lb)   SpO2 98%   BMI 23.01 kg/m   Physical Exam  Constitutional: He is oriented to person, place, and time. He appears well-developed and well-nourished.  HENT:  Head: Normocephalic and atraumatic.  Eyes: Pupils are equal, round, and reactive to light.  Neck: Normal range of motion. Neck supple.  Tenderness to palpation left sternocleidomastoid, no midline tenderness, step-off, or deformity  Cardiovascular: Normal  rate, regular rhythm and normal heart sounds.  No murmur heard. Pulmonary/Chest: Effort normal and breath sounds normal. No respiratory distress. He has no wheezes.  Abdominal: Soft. Bowel sounds are normal. There is no tenderness. There is no rebound.  Musculoskeletal: He exhibits no edema.  Lymphadenopathy:    He has no cervical adenopathy.  Neurological: He is alert and oriented to person, place, and time.  Cranial nerves II through XII intact, 5 out of 5 strength in all 4 extremities, no dysmetria to finger-nose-finger  Skin: Skin is warm and dry.  Psychiatric: He has a normal mood and affect.  Nursing note and vitals  reviewed.    ED Treatments / Results  Labs (all labs ordered are listed, but only abnormal results are displayed) Labs Reviewed  BASIC METABOLIC PANEL - Abnormal; Notable for the following components:      Result Value   Potassium 3.4 (*)    Creatinine, Ser 1.27 (*)    Calcium 8.7 (*)    All other components within normal limits  CBC - Abnormal; Notable for the following components:   Hemoglobin 12.7 (*)    HCT 38.4 (*)    All other components within normal limits  URINALYSIS, ROUTINE W REFLEX MICROSCOPIC    EKG  EKG Interpretation  Date/Time:  Tuesday June 13 2017 22:48:16 EST Ventricular Rate:  74 PR Interval:  148 QRS Duration: 102 QT Interval:  398 QTC Calculation: 441 R Axis:   91 Text Interpretation:  Normal sinus rhythm Rightward axis Anterior infarct , age undetermined Abnormal ECG Confirmed by Ross MarcusHorton, Courtney ([id number]) on 06/13/2017 11:48:49 PM       Radiology No results found.  Procedures Procedures (including critical care time)  Medications Ordered in ED Medications  sodium chloride 0.9 % bolus 1,000 mL (1,000 mLs Intravenous New Bag/Given 06/14/17 0022)  prochlorperazine (COMPAZINE) injection 10 mg (10 mg Intravenous Given 06/14/17 0022)  diphenhydrAMINE (BENADRYL) injection 25 mg (25 mg Intravenous Given 06/14/17 0022)     Initial Impression / Assessment and Plan / ED Course  I have reviewed the triage vital signs and the nursing notes.  Pertinent labs & imaging results that were available during my care of the patient were reviewed by me and considered in my medical decision making (see chart for details).  Clinical Course as of Jun 14 145  Wed Jun 14, 2017  0144 Patient states that he feels much better.  Current pain is 5 out of 10.  He is ambulatory without difficulty.  Will discharge home.  [CH]    Clinical Course User Index [CH] Horton, Mayer Maskerourtney F, MD    Patient presents with migraine and syncope.  History of migraines.  Headache is  similar to prior migraines.  No other red flags.  Patient also has a history of syncope.  He reports a prodrome of dizziness.  He is nontoxic on exam.  Vital signs are notable for hypertension.  Neurologic exam is normal.  Basic lab work obtained and reassuring.  Negative orthostatics.  EKG shows no evidence of arrhythmia.  Suspect migraine and vasovagal syncope.  Patient was given a migraine cocktail.  On recheck he states he feels much better and is ready to go home.  No indication for imaging at this time.  After history, exam, and medical workup I feel the patient has been appropriately medically screened and is safe for discharge home. Pertinent diagnoses were discussed with the patient. Patient was given return precautions.   Final Clinical Impressions(s) / ED  Diagnoses   Final diagnoses:  Migraine without aura and without status migrainosus, not intractable  Vasovagal syncope    ED Discharge Orders    None       Horton, Mayer Masker, MD 06/14/17 [phone]

## 2017-08-28 ENCOUNTER — Encounter (HOSPITAL_COMMUNITY): Payer: Self-pay | Admitting: Emergency Medicine

## 2017-08-28 ENCOUNTER — Other Ambulatory Visit: Payer: Self-pay

## 2017-08-28 ENCOUNTER — Emergency Department (HOSPITAL_COMMUNITY)
Admission: EM | Admit: 2017-08-28 | Discharge: 2017-08-28 | Disposition: A | Discharge status: Home / Self Care | Payer: Self-pay | Attending: Emergency Medicine | Admitting: Emergency Medicine

## 2017-08-28 DIAGNOSIS — F17210 Nicotine dependence, cigarettes, uncomplicated: Secondary | ICD-10-CM | POA: Insufficient documentation

## 2017-08-28 DIAGNOSIS — K047 Periapical abscess without sinus: Secondary | ICD-10-CM | POA: Insufficient documentation

## 2017-08-28 DIAGNOSIS — J45909 Unspecified asthma, uncomplicated: Secondary | ICD-10-CM | POA: Insufficient documentation

## 2017-08-28 MED ORDER — CLINDAMYCIN HCL 300 MG PO CAPS: 300.0000 mg | ORAL_CAPSULE | Freq: Four times a day (QID) | ORAL | 0 refills | Status: DC

## 2017-08-28 MED ORDER — HYDROCODONE-ACETAMINOPHEN 5-325 MG PO TABS: 2.0000 | ORAL_TABLET | ORAL | 0 refills | Status: DC | PRN

## 2017-08-28 MED ORDER — HYDROCODONE-ACETAMINOPHEN 5-325 MG PO TABS
2.0000 | ORAL_TABLET | Freq: Once | ORAL | Status: AC
  Administered 2017-08-28: 2 via ORAL
  Filled 2017-08-28: qty 2

## 2017-08-28 MED ORDER — CLINDAMYCIN HCL 150 MG PO CAPS
300.0000 mg | ORAL_CAPSULE | Freq: Once | ORAL | Status: AC
  Administered 2017-08-28: 300 mg via ORAL
  Filled 2017-08-28: qty 2

## 2017-08-28 NOTE — Discharge Instructions (Signed)
Return if any problems.

## 2017-08-28 NOTE — ED Provider Notes (Signed)
[facility] EMERGENCY DEPARTMENT Provider Note   CSN: [id number] Arrival date & time: 08/28/17  2058     History   Chief Complaint Chief Complaint  Patient presents with  . Dental Pain    HPI Jon Nolan is a 32 y.o. male.  The history is provided by the patient. No language interpreter was used.  Dental Pain   This is a new problem. The current episode started yesterday. The problem occurs constantly. The problem has been gradually worsening. The pain is moderate. He has tried nothing for the symptoms. The treatment provided no relief.  Pt complains of a bad toothache.  Pt reports swelling around gum  Past Medical History:  Diagnosis Date  . Asthma   . Headache     There are no active problems to display for this patient.   History reviewed. No pertinent surgical history.     Home Medications    Prior to Admission medications   Medication Sig Start Date End Date Taking? Authorizing Provider  clindamycin (CLEOCIN) 300 MG capsule Take 1 capsule (300 mg total) by mouth every 6 (six) hours. 08/28/17   Elson Areas, PA-C  HYDROcodone-acetaminophen (NORCO/VICODIN) 5-325 MG tablet Take 2 tablets by mouth every 4 (four) hours as needed. 08/28/17   Elson Areas, PA-C  naproxen (NAPROSYN) 500 MG tablet Take 1 tablet (500 mg total) by mouth 2 (two) times daily with a meal. Patient not taking: Reported on 06/17/2014 04/07/14   Eber Hong, MD  ondansetron (ZOFRAN) 4 MG tablet Take 1 tablet (4 mg total) by mouth every 6 (six) hours. Patient not taking: Reported on 06/14/2017 08/29/14   Marlon Pel, PA-C  traMADol (ULTRAM) 50 MG tablet Take 1 tablet (50 mg total) by mouth every 6 (six) hours as needed. Patient not taking: Reported on 06/14/2017 08/29/14   Marlon Pel, PA-C    Family History No family history on file.  Social History Social History   Tobacco Use  . Smoking status: Current Every Day Smoker    Packs/day: 0.25    Types: Cigarettes  .  Smokeless tobacco: Never Used  Substance Use Topics  . Alcohol use: Yes    Alcohol/week: 0.6 oz    Types: 1 Cans of beer per week  . Drug use: No     Allergies   Patient has no known allergies.   Review of Systems Review of Systems  HENT: Positive for dental problem.   All other systems reviewed and are negative.    Physical Exam Updated Vital Signs BP (!) 180/115 (BP Location: Right Arm)   Pulse 70   Temp 98.6 F (37 C) (Oral)   Resp 16   Ht 6' (1.829 m)   Wt 74.8 kg (165 lb)   SpO2 99%   BMI 22.38 kg/m   Physical Exam  Constitutional: He appears well-developed and well-nourished.  HENT:  Head: Normocephalic and atraumatic.  Swelling upper gumline,    Eyes: Conjunctivae are normal.  Neck: Neck supple.  Cardiovascular: Normal rate.  No murmur heard. Pulmonary/Chest: Effort normal. No respiratory distress.  Abdominal: There is no tenderness.  Musculoskeletal: He exhibits no edema.  Neurological: He is alert.  Skin: Skin is warm and dry.  Psychiatric: He has a normal mood and affect.  Nursing note and vitals reviewed.    ED Treatments / Results  Labs (all labs ordered are listed, but only abnormal results are displayed) Labs Reviewed - No data to display  EKG  EKG Interpretation None  Radiology No results found.  Procedures Procedures (including critical care time)  Medications Ordered in ED Medications  HYDROcodone-acetaminophen (NORCO/VICODIN) 5-325 MG per tablet 2 tablet (2 tablets Oral Given 08/28/17 2131)  clindamycin (CLEOCIN) capsule 300 mg (300 mg Oral Given 08/28/17 2131)     Initial Impression / Assessment and Plan / ED Course  I have reviewed the triage vital signs and the nursing notes.  Pertinent labs & imaging results that were available during my care of the patient were reviewed by me and considered in my medical decision making (see chart for details).     Pt advised to follow up with dentist for evaluation. Pt  advised he needs recheck of blood pressure.   Final Clinical Impressions(s) / ED Diagnoses   Final diagnoses:  Dental abscess    ED Discharge Orders        Ordered    clindamycin (CLEOCIN) 300 MG capsule  Every 6 hours     08/28/17 2129    HYDROcodone-acetaminophen (NORCO/VICODIN) 5-325 MG tablet  Every 4 hours PRN     08/28/17 2129    An After Visit Summary was printed and given to the patient.   Osie CheeksSofia, Salene Mohamud K, PA-C 08/28/17 2144    Doug SouJacubowitz, Sam, MD 08/29/17 [phone]

## 2017-08-28 NOTE — ED Notes (Signed)
Pt alert & oriented x4, stable gait. Patient given discharge instructions, paperwork & prescription(s). Patient  instructed to stop at the registration desk to finish any additional paperwork. Patient verbalized understanding. Pt left department w/ no further questions. 

## 2017-08-28 NOTE — ED Triage Notes (Signed)
Pt c/o left sided dental pain since yesterday.

## 2017-11-21 ENCOUNTER — Emergency Department (HOSPITAL_COMMUNITY): Admission: EM | Admit: 2017-11-21 | Payer: Self-pay | Source: Home / Self Care

## 2017-11-21 ENCOUNTER — Emergency Department (HOSPITAL_COMMUNITY): Admission: EM | Admit: 2017-11-21 | Discharge: 2017-11-21 | Discharge status: Absent without leave | Payer: Self-pay

## 2019-10-13 ENCOUNTER — Encounter (HOSPITAL_COMMUNITY): Payer: Self-pay

## 2019-10-13 ENCOUNTER — Other Ambulatory Visit: Payer: Self-pay

## 2019-10-13 ENCOUNTER — Ambulatory Visit (HOSPITAL_COMMUNITY)
Admission: EM | Admit: 2019-10-13 | Discharge: 2019-10-13 | Disposition: A | Discharge status: Home / Self Care | Payer: Self-pay | Attending: Family Medicine | Admitting: Family Medicine

## 2019-10-13 DIAGNOSIS — J020 Streptococcal pharyngitis: Secondary | ICD-10-CM

## 2019-10-13 LAB — POCT RAPID STREP A: Streptococcus, Group A Screen (Direct): POSITIVE — AB

## 2019-10-13 MED ORDER — DEXAMETHASONE SODIUM PHOSPHATE 10 MG/ML IJ SOLN
INTRAMUSCULAR | Status: AC
  Filled 2019-10-13: qty 1

## 2019-10-13 MED ORDER — DEXAMETHASONE SODIUM PHOSPHATE 10 MG/ML IJ SOLN
10.0000 mg | Freq: Once | INTRAMUSCULAR | Status: AC
  Administered 2019-10-13: 10 mg via INTRAMUSCULAR

## 2019-10-13 MED ORDER — AMOXICILLIN 500 MG PO CAPS: 500.0000 mg | ORAL_CAPSULE | Freq: Two times a day (BID) | ORAL | 0 refills | Status: AC

## 2019-10-13 NOTE — ED Triage Notes (Signed)
Pt present swollen glands, symptom started this morning.

## 2019-10-13 NOTE — ED Provider Notes (Signed)
[facility]    CSN: [id number] Arrival date & time: 10/13/19  1132      History   Chief Complaint Chief Complaint  Patient presents with  . Sore Throat    HPI Jon Nolan is a 34 y.o. male.   Patient reports sore throat, swollen glands that started this morning.  Denies sick contacts.  Reports that this is what he feels like when he has had strep in the past.  Denies fever, headaches, chills, body aches, nausea, vomiting, diarrhea, rash, other symptoms.  ROS Per HPI  The history is provided by the patient.    Past Medical History:  Diagnosis Date  . Asthma   . Headache     There are no problems to display for this patient.   History reviewed. No pertinent surgical history.     Home Medications    Prior to Admission medications   Medication Sig Start Date End Date Taking? Authorizing Provider  amoxicillin (AMOXIL) 500 MG capsule Take 1 capsule (500 mg total) by mouth 2 (two) times daily for 7 days. 10/13/19 10/20/19  Moshe Cipro, NP  clindamycin (CLEOCIN) 300 MG capsule Take 1 capsule (300 mg total) by mouth every 6 (six) hours. 08/28/17   Elson Areas, PA-C  HYDROcodone-acetaminophen (NORCO/VICODIN) 5-325 MG tablet Take 2 tablets by mouth every 4 (four) hours as needed. 08/28/17   Elson Areas, PA-C  naproxen (NAPROSYN) 500 MG tablet Take 1 tablet (500 mg total) by mouth 2 (two) times daily with a meal. Patient not taking: Reported on 06/17/2014 04/07/14   Eber Hong, MD  ondansetron (ZOFRAN) 4 MG tablet Take 1 tablet (4 mg total) by mouth every 6 (six) hours. Patient not taking: Reported on 06/14/2017 08/29/14   Marlon Pel, PA-C  traMADol (ULTRAM) 50 MG tablet Take 1 tablet (50 mg total) by mouth every 6 (six) hours as needed. Patient not taking: Reported on 06/14/2017 08/29/14   Marlon Pel, PA-C    Family History History reviewed. No pertinent family history.  Social History Social History   Tobacco Use  . Smoking status:  Current Every Day Smoker    Packs/day: 0.25    Types: Cigarettes  . Smokeless tobacco: Never Used  Substance Use Topics  . Alcohol use: Yes    Alcohol/week: 1.0 standard drinks    Types: 1 Cans of beer per week  . Drug use: No     Allergies   Patient has no known allergies.   Review of Systems Review of Systems   Physical Exam Triage Vital Signs ED Triage Vitals  Enc Vitals Group     BP 10/13/19 1155 (!) 152/104     Pulse Rate 10/13/19 1155 91     Resp 10/13/19 1155 18     Temp 10/13/19 1155 98.8 F (37.1 C)     Temp Source 10/13/19 1155 Oral     SpO2 10/13/19 1155 100 %     Weight --      Height --      Head Circumference --      Peak Flow --      Pain Score 10/13/19 1157 9     Pain Loc --      Pain Edu? --      Excl. in GC? --    No data found.  Updated Vital Signs BP (!) 152/104 (BP Location: Right Arm)   Pulse 91   Temp 98.8 F (37.1 C) (Oral)   Resp 18  SpO2 100%       Physical Exam Vitals and nursing note reviewed.  Constitutional:      General: He is not in acute distress.    Appearance: Normal appearance. He is well-developed and normal weight. He is ill-appearing.  HENT:     Head: Normocephalic and atraumatic.     Right Ear: Tympanic membrane normal.     Left Ear: Tympanic membrane normal.     Nose: Nose normal.     Mouth/Throat:     Mouth: Mucous membranes are moist.     Pharynx: Posterior oropharyngeal erythema present.     Comments: +2 tonsillar swelling bilaterally Eyes:     Conjunctiva/sclera: Conjunctivae normal.  Cardiovascular:     Rate and Rhythm: Normal rate and regular rhythm.     Heart sounds: Normal heart sounds. No murmur.  Pulmonary:     Effort: Pulmonary effort is normal. No respiratory distress.     Breath sounds: Normal breath sounds. No stridor. No wheezing, rhonchi or rales.  Chest:     Chest wall: No tenderness.  Abdominal:     General: Bowel sounds are normal. There is no distension.     Palpations: Abdomen  is soft. There is no mass.     Tenderness: There is no abdominal tenderness. There is no guarding or rebound.     Hernia: No hernia is present.  Musculoskeletal:     Cervical back: Neck supple.  Skin:    General: Skin is warm and dry.     Capillary Refill: Capillary refill takes less than 2 seconds.  Neurological:     General: No focal deficit present.     Mental Status: He is alert and oriented to person, place, and time.  Psychiatric:        Mood and Affect: Mood normal.        Behavior: Behavior normal.      UC Treatments / Results  Labs (all labs ordered are listed, but only abnormal results are displayed) Labs Reviewed  POCT RAPID STREP A - Abnormal; Notable for the following components:      Result Value   Streptococcus, Group A Screen (Direct) POSITIVE (*)    All other components within normal limits    EKG   Radiology No results found.  Procedures Procedures (including critical care time)  Medications Ordered in UC Medications  dexamethasone (DECADRON) injection 10 mg (10 mg Intramuscular Given 10/13/19 1241)    Initial Impression / Assessment and Plan / UC Course  I have reviewed the triage vital signs and the nursing notes.  Pertinent labs & imaging results that were available during my care of the patient were reviewed by me and considered in my medical decision making (see chart for details).  Clinical Course as of Oct 13 1519  Sun Oct 13, 2019  1210 POCT Rapid Strep [SM]    Clinical Course User Index [SM] Faustino Congress, NP    Strep throat.  Rapid strep positive in office today.  Amoxicillin 500 twice daily x10 days sent to patient's pharmacy.  Instructed to follow-up if not starting to feel better by Tuesday.  Decadron 10 mg IM given in office today to help with tonsillar swelling.  Instructed to follow-up in the ER for shortness of breath, high fever, severe diarrhea, other concerning symptoms. Final Clinical Impressions(s) / UC Diagnoses    Final diagnoses:  Strep throat     Discharge Instructions     You have strep throat.  I have  sent in amoxicillin to your pharmacy, twice a day for 10 days.  Follow-up if symptoms are not improving by Tuesday.  Go to the ER with shortness of breath, high fever, severe diarrhea, other concerning symptoms.    ED Prescriptions    Medication Sig Dispense Auth. Provider   amoxicillin (AMOXIL) 500 MG capsule Take 1 capsule (500 mg total) by mouth 2 (two) times daily for 7 days. 14 capsule Moshe Cipro, NP     I have reviewed the PDMP during this encounter.   Moshe Cipro, NP 10/13/19 1521

## 2019-10-13 NOTE — Discharge Instructions (Addendum)
You have strep throat.  I have sent in amoxicillin to your pharmacy, twice a day for 10 days.  Follow-up if symptoms are not improving by Tuesday.  Go to the ER with shortness of breath, high fever, severe diarrhea, other concerning symptoms.

## 2020-06-08 ENCOUNTER — Emergency Department (HOSPITAL_COMMUNITY)
Admission: EM | Admit: 2020-06-08 | Discharge: 2020-06-08 | Disposition: A | Discharge status: Home / Self Care | Payer: Self-pay | Attending: Emergency Medicine | Admitting: Emergency Medicine

## 2020-06-08 ENCOUNTER — Encounter (HOSPITAL_COMMUNITY): Payer: Self-pay

## 2020-06-08 DIAGNOSIS — M25512 Pain in left shoulder: Secondary | ICD-10-CM | POA: Insufficient documentation

## 2020-06-08 DIAGNOSIS — Z5321 Procedure and treatment not carried out due to patient leaving prior to being seen by health care provider: Secondary | ICD-10-CM | POA: Insufficient documentation

## 2020-06-08 DIAGNOSIS — R202 Paresthesia of skin: Secondary | ICD-10-CM | POA: Insufficient documentation

## 2020-06-08 NOTE — ED Notes (Signed)
Pt did not respond when called for final call for vitals check  

## 2020-06-08 NOTE — ED Notes (Signed)
Pt did not respond when called for vitals check X2 
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
## 2020-06-25 ENCOUNTER — Encounter (HOSPITAL_COMMUNITY): Payer: Self-pay

## 2020-06-25 ENCOUNTER — Ambulatory Visit (HOSPITAL_COMMUNITY)
Admission: EM | Admit: 2020-06-25 | Discharge: 2020-06-25 | Disposition: A | Discharge status: Home / Self Care | Payer: Self-pay | Attending: Emergency Medicine | Admitting: Emergency Medicine

## 2020-06-25 ENCOUNTER — Other Ambulatory Visit: Payer: Self-pay

## 2020-06-25 DIAGNOSIS — Z9181 History of falling: Secondary | ICD-10-CM

## 2020-06-25 DIAGNOSIS — R202 Paresthesia of skin: Secondary | ICD-10-CM | POA: Insufficient documentation

## 2020-06-25 DIAGNOSIS — R251 Tremor, unspecified: Secondary | ICD-10-CM | POA: Insufficient documentation

## 2020-06-25 LAB — CBC WITH DIFFERENTIAL/PLATELET
Abs Immature Granulocytes: 0.03 10*3/uL (ref 0.00–0.07)
Basophils Absolute: 0.1 10*3/uL (ref 0.0–0.1)
Basophils Relative: 1 %
Eosinophils Absolute: 0.2 10*3/uL (ref 0.0–0.5)
Eosinophils Relative: 2 %
HCT: 44.5 % (ref 39.0–52.0)
Hemoglobin: 14.4 g/dL (ref 13.0–17.0)
Immature Granulocytes: 0 %
Lymphocytes Relative: 14 %
Lymphs Abs: 1.4 10*3/uL (ref 0.7–4.0)
MCH: 28.8 pg (ref 26.0–34.0)
MCHC: 32.4 g/dL (ref 30.0–36.0)
MCV: 89 fL (ref 80.0–100.0)
Monocytes Absolute: 0.5 10*3/uL (ref 0.1–1.0)
Monocytes Relative: 6 %
Neutro Abs: 7.7 10*3/uL (ref 1.7–7.7)
Neutrophils Relative %: 77 %
Platelets: 232 10*3/uL (ref 150–400)
RBC: 5 MIL/uL (ref 4.22–5.81)
RDW: 14.4 % (ref 11.5–15.5)
WBC: 9.9 10*3/uL (ref 4.0–10.5)
nRBC: 0 % (ref 0.0–0.2)

## 2020-06-25 LAB — TSH: TSH: 0.456 u[IU]/mL (ref 0.350–4.500)

## 2020-06-25 LAB — COMPREHENSIVE METABOLIC PANEL
ALT: 16 U/L (ref 0–44)
AST: 22 U/L (ref 15–41)
Albumin: 4.2 g/dL (ref 3.5–5.0)
Alkaline Phosphatase: 55 U/L (ref 38–126)
Anion gap: 7 (ref 5–15)
BUN: 5 mg/dL — ABNORMAL LOW (ref 6–20)
CO2: 29 mmol/L (ref 22–32)
Calcium: 9.6 mg/dL (ref 8.9–10.3)
Chloride: 107 mmol/L (ref 98–111)
Creatinine, Ser: 1.29 mg/dL — ABNORMAL HIGH (ref 0.61–1.24)
GFR, Estimated: >60 mL/min (ref >60)
Glucose, Bld: 105 mg/dL — ABNORMAL HIGH (ref 70–99)
Potassium: 4.3 mmol/L (ref 3.5–5.1)
Sodium: 143 mmol/L (ref 135–145)
Total Bilirubin: 0.9 mg/dL (ref 0.3–1.2)
Total Protein: 7.3 g/dL (ref 6.5–8.1)

## 2020-06-25 LAB — CBG MONITORING, ED: Glucose-Capillary: 102 mg/dL — ABNORMAL HIGH (ref 70–99)

## 2020-06-25 LAB — VITAMIN B12: Vitamin B-12: 272 pg/mL (ref 180–914)

## 2020-06-25 NOTE — Discharge Instructions (Addendum)
I am checking blood work - I will call if abnormal Please get set up with primary care for further evaluation of symptoms/neurology referal If symptoms worsening, go to emergency room

## 2020-06-25 NOTE — ED Provider Notes (Signed)
[facility]    CSN: [id number] Arrival date & time: 06/25/20  1304      History   Chief Complaint Chief Complaint  Patient presents with  . Fall  . Imbalance  . Extremity Numbness & Weakness    HPI Jon Nolan is a 34 y.o. male history of asthma presenting today for evaluation of shaking and paresthesias.  Patient reports over the past few months he has had increased shaking in his hands and legs, feels this bilaterally, but more prominent on right side.  Recently this shaking has led to him losing his balance and falling.  He denies hitting head or loss of consciousness.  He reports remote injury approximately 8 months ago where he was pulling a car out of the mud and ended up falling and landing on his left shoulder.  Since he has had increased numbness/tingling into bilateral hands/upper extremities.  He denies any associated dizziness lightheadedness, confusion, headaches or vision changes associated with becoming off balance. Denies weakness.  He feels often as if he has to pick his right foot up off of the ground more.  He denies any chest pain or shortness of breath.  Denies any new medicines.  Does report tobacco use, marijuana use.  Reports cocaine use 1 week ago, but denies any sudden worsening after use.  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  HPI  Past Medical History:  Diagnosis Date  . Asthma   . Headache     There are no problems to display for this patient.   History reviewed. No pertinent surgical history.     Home Medications    Prior to Admission medications   Not on File    Family History Family History  Family history unknown: Yes    Social History Social History   Tobacco Use  . Smoking status: Current Every Day Smoker    Packs/day: 0.25    Types: Cigarettes  . Smokeless tobacco: Never Used  Substance Use Topics  . Alcohol use: Yes    Alcohol/week: 1.0 standard drink     Types: 1 Cans of beer per week  . Drug use: No     Allergies   Patient has no known allergies.   Review of Systems Review of Systems  Constitutional: Negative for fatigue and fever.  HENT: Negative for congestion, sinus pressure and sore throat.   Eyes: Negative for photophobia, pain and visual disturbance.  Respiratory: Negative for cough and shortness of breath.   Cardiovascular: Negative for chest pain.  Gastrointestinal: Negative for abdominal pain, nausea and vomiting.  Genitourinary: Negative for decreased urine volume and hematuria.  Musculoskeletal: Negative for myalgias, neck pain and neck stiffness.  Neurological: Positive for tremors and numbness. Negative for dizziness, syncope, facial asymmetry, speech difficulty, weakness, light-headedness and headaches.     Physical Exam Triage Vital Signs ED Triage Vitals  Enc Vitals Group     BP      Pulse      Resp      Temp      Temp src      SpO2      Weight      Height      Head Circumference      Peak Flow      Pain Score      Pain Loc      Pain Edu?      Excl. in GC?  No data found.  Updated Vital Signs BP (!) 159/113 (BP Location: Right Arm)   Pulse 90   Temp 98.1 F (36.7 C) (Oral)   Resp 18   SpO2 100%   Visual Acuity Right Eye Distance:   Left Eye Distance:   Bilateral Distance:    Right Eye Near:   Left Eye Near:    Bilateral Near:     Physical Exam Vitals and nursing note reviewed.  Constitutional:      Appearance: He is well-developed.     Comments: No acute distress  HENT:     Head: Normocephalic and atraumatic.     Ears:     Comments: Bilateral ears without tenderness to palpation of external auricle, tragus and mastoid, EAC's without erythema or swelling, TM's with good bony landmarks and cone of light. Non erythematous.     Nose: Nose normal.     Mouth/Throat:     Comments: Oral mucosa pink and moist, no tonsillar enlargement or exudate. Posterior pharynx patent and  nonerythematous, no uvula deviation or swelling. Normal phonation. Eyes:     Conjunctiva/sclera: Conjunctivae normal.  Cardiovascular:     Rate and Rhythm: Normal rate.  Pulmonary:     Effort: Pulmonary effort is normal. No respiratory distress.     Comments: Breathing comfortably at rest, CTABL, no wheezing, rales or other adventitious sounds auscultated Abdominal:     General: There is no distension.  Musculoskeletal:        General: Normal range of motion.     Cervical back: Neck supple.  Skin:    General: Skin is warm and dry.  Neurological:     General: No focal deficit present.     Mental Status: He is alert and oriented to person, place, and time. Mental status is at baseline.     Gait: Gait abnormal.     Comments: Patient A&O x3, cranial nerves II-XII grossly intact, strength at shoulders, hips and knees 5/5, equal bilaterally, patellar reflex 2+ bilaterally. Normal Finger to nose, RAM and heel to shin. Negative Romberg; gait with abnormality, appears to walk with abnormality affecting right leg      UC Treatments / Results  Labs (all labs ordered are listed, but only abnormal results are displayed) Labs Reviewed  CBG MONITORING, ED - Abnormal; Notable for the following components:      Result Value   Glucose-Capillary 102 (*)    All other components within normal limits  CBC WITH DIFFERENTIAL/PLATELET  COMPREHENSIVE METABOLIC PANEL  TSH  VITAMIN B12    EKG   Radiology No results found.  Procedures Procedures (including critical care time)  Medications Ordered in UC Medications - No data to display  Initial Impression / Assessment and Plan / UC Course  I have reviewed the triage vital signs and the nursing notes.  Pertinent labs & imaging results that were available during my care of the patient were reviewed by me and considered in my medical decision making (see chart for details).     Blood sugar 102 today, checking basic labs, patient likely will  benefit from imaging of spine/head.  No acute neuro deficits noted on exam today, recommending close PCP follow-up with potential neurology referral.  Unclear cause of symptoms, does not have any weakness, dizziness seems more related to shaking/tremor rather than central cause.  Patient to go to emergency room if developing worsening symptoms.  Discussed strict return precautions. Patient verbalized understanding and is agreeable with plan.  Final Clinical Impressions(s) /  UC Diagnoses   Final diagnoses:  Paresthesia of upper and lower extremities of both sides  Tremor of unknown origin     Discharge Instructions     I am checking blood work - I will call if abnormal Please get set up with primary care for further evaluation of symptoms/neurology referal If symptoms worsening, go to emergency room     ED Prescriptions    None     PDMP not reviewed this encounter.   Lew Dawes, [place] 06/25/20 1419

## 2020-06-25 NOTE — ED Triage Notes (Signed)
Pt presents with feeling off balance and extremity weakness & numbness X 2 months.

## 2020-06-29 ENCOUNTER — Ambulatory Visit (HOSPITAL_COMMUNITY)
Admission: EM | Admit: 2020-06-29 | Discharge: 2020-06-29 | Disposition: A | Discharge status: Home / Self Care | Payer: Self-pay | Attending: Family Medicine | Admitting: Family Medicine

## 2020-06-29 ENCOUNTER — Other Ambulatory Visit: Payer: Self-pay

## 2020-06-29 ENCOUNTER — Ambulatory Visit (INDEPENDENT_AMBULATORY_CARE_PROVIDER_SITE_OTHER): Payer: Self-pay

## 2020-06-29 ENCOUNTER — Encounter (HOSPITAL_COMMUNITY): Payer: Self-pay

## 2020-06-29 DIAGNOSIS — M545 Low back pain, unspecified: Secondary | ICD-10-CM

## 2020-06-29 DIAGNOSIS — M79604 Pain in right leg: Secondary | ICD-10-CM

## 2020-06-29 MED ORDER — TRIAMCINOLONE ACETONIDE 40 MG/ML IJ SUSP
40.0000 mg | Freq: Once | INTRAMUSCULAR | Status: AC
  Administered 2020-06-29: 40 mg via INTRAMUSCULAR

## 2020-06-29 MED ORDER — NAPROXEN 500 MG PO TABS: 500.0000 mg | ORAL_TABLET | Freq: Two times a day (BID) | ORAL | 0 refills | Status: DC | PRN

## 2020-06-29 MED ORDER — CYCLOBENZAPRINE HCL 10 MG PO TABS: 10.0000 mg | ORAL_TABLET | Freq: Three times a day (TID) | ORAL | 0 refills | Status: DC | PRN

## 2020-06-29 MED ORDER — TRIAMCINOLONE ACETONIDE 40 MG/ML IJ SUSP
INTRAMUSCULAR | Status: AC
  Filled 2020-06-29: qty 1

## 2020-06-29 NOTE — ED Triage Notes (Signed)
Pt presents with right leg pain and weakness that has progressed since last visit; pt states he has a follow up appt scheduled but it not until next week.

## 2020-07-01 NOTE — ED Provider Notes (Signed)
[facility]    CSN: [id number] Arrival date & time: 06/29/20  1713      History   Chief Complaint Chief Complaint  Patient presents with  . Follow-up    HPI Jon Nolan is a 34 y.o. male.   Patient presenting today with right leg pain and low back pain progressively worsening over the past week. Has ongoing history of weakness, numbness, visual changes, falls/balance issues that he will soon be further evaluated by PCP for and is stable but this pain is new. Had a fall 8 months ago onto his left shoulder that he feels started all of these issues. 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      Past Medical History:  Diagnosis Date  . Asthma   . Headache     There are no problems to display for this patient.   History reviewed. No pertinent surgical history.     Home Medications    Prior to Admission medications   Medication Sig Start Date End Date Taking? Authorizing Provider  cyclobenzaprine (FLEXERIL) 10 MG tablet Take 1 tablet (10 mg total) by mouth 3 (three) times daily as needed for muscle spasms. DO NOT DRINK ALCOHOL OR DRIVE WHILE TAKING THIS MEDICATION 06/29/20   Particia Nearing, PA-C  naproxen (NAPROSYN) 500 MG tablet Take 1 tablet (500 mg total) by mouth 2 (two) times daily as needed. 06/29/20   Particia Nearing, PA-C    Family History Family History  Family history unknown: Yes    Social History Social History   Tobacco Use  . Smoking status: Current Every Day Smoker    Packs/day: 0.25    Types: Cigarettes  . Smokeless tobacco: Never Used  Substance Use Topics  . Alcohol use: Yes    Alcohol/week: 1.0 standard drink    Types: 1 Cans of beer per week  . Drug use: No     Allergies   Patient has no known allergies.   Review of Systems Review of Systems PER HPI    Physical Exam Triage Vital Signs ED Triage Vitals  Enc  Vitals Group     BP 06/29/20 1838 (!) 153/102     Pulse Rate 06/29/20 1838 77     Resp 06/29/20 1838 20     Temp 06/29/20 1838 98.2 F (36.8 C)     Temp Source 06/29/20 1838 Oral     SpO2 06/29/20 1838 100 %     Weight --      Height --      Head Circumference --      Peak Flow --      Pain Score 06/29/20 1837 8     Pain Loc --      Pain Edu? --      Excl. in GC? --    No data found.  Updated Vital Signs BP (!) 153/102 (BP Location: Right Arm)   Pulse 77   Temp 98.2 F (36.8 C) (Oral)   Resp 20   SpO2 100%   Visual Acuity Right Eye Distance:   Left Eye Distance:   Bilateral Distance:    Right Eye Near:   Left Eye Near:    Bilateral Near:     Physical Exam Vitals and nursing note reviewed.  Constitutional:      Appearance: Normal appearance.  HENT:     Head: Atraumatic.  Eyes:  Extraocular Movements: Extraocular movements intact.     Conjunctiva/sclera: Conjunctivae normal.  Cardiovascular:     Rate and Rhythm: Normal rate and regular rhythm.     Pulses: Normal pulses.     Heart sounds: Normal heart sounds.  Pulmonary:     Effort: Pulmonary effort is normal.     Breath sounds: Normal breath sounds.  Abdominal:     General: Bowel sounds are normal. There is no distension.     Palpations: Abdomen is soft.     Tenderness: There is no abdominal tenderness. There is no guarding.  Musculoskeletal:        General: Tenderness (ttp right lumbar paraspinal muscles and tenderness down into right lateral hip and posterior thigh) present. No swelling or deformity. Normal range of motion.     Cervical back: Normal range of motion and neck supple.     Comments: - SLR b/l Ambulating with a cane today, no significant limp notable during exam  Skin:    General: Skin is warm and dry.     Findings: No bruising or erythema.  Neurological:     General: No focal deficit present.     Mental Status: He is oriented to person, place, and time.     Sensory: No sensory  deficit.  Psychiatric:        Mood and Affect: Mood normal.        Thought Content: Thought content normal.        Judgment: Judgment normal.     UC Treatments / Results  Labs (all labs ordered are listed, but only abnormal results are displayed) Labs Reviewed - No data to display  EKG   Radiology DG Lumbar Spine Complete  Result Date: 06/29/2020 CLINICAL DATA:  Ongoing mid low back pain x8 months. EXAM: LUMBAR SPINE - COMPLETE 4+ VIEW COMPARISON:  None. FINDINGS: There is no evidence of lumbar spine fracture. Alignment is normal. Intervertebral disc spaces are maintained. IMPRESSION: Negative. Electronically Signed   By: Katherine Mantle M.D.   On: 06/29/2020 19:40    Procedures Procedures (including critical care time)  Medications Ordered in UC Medications  triamcinolone acetonide (KENALOG-40) injection 40 mg (40 mg Intramuscular Given 06/29/20 2006)    Initial Impression / Assessment and Plan / UC Course  I have reviewed the triage vital signs and the nursing notes.  Pertinent labs & imaging results that were available during my care of the patient were reviewed by me and considered in my medical decision making (see chart for details).     Neurologically intact today, suspect some muscle spasms causing his current new onset pain. Will tx with naproxen and flexeril and give work note, await PCP f/u next week. Discussed at length ED precautions if sxs worsen in meantime given his ongoing vague neurologic-seeming sxs. He is agreeable to this plan.   Final Clinical Impressions(s) / UC Diagnoses   Final diagnoses:  Right leg pain   Discharge Instructions   None    ED Prescriptions    Medication Sig Dispense Auth. Provider   cyclobenzaprine (FLEXERIL) 10 MG tablet Take 1 tablet (10 mg total) by mouth 3 (three) times daily as needed for muscle spasms. DO NOT DRINK ALCOHOL OR DRIVE WHILE TAKING THIS MEDICATION 30 tablet Particia Nearing, PA-C   naproxen  (NAPROSYN) 500 MG tablet Take 1 tablet (500 mg total) by mouth 2 (two) times daily as needed. 30 tablet Particia Nearing, [place]     PDMP not reviewed this encounter.  Particia Nearing, [place] 07/01/20 1635

## 2020-07-04 NOTE — Progress Notes (Signed)
Subjective:    Patient ID: Jon Nolan, male    DOB: [birth year], 34 y.o.   MRN: [id number]   07/06/2020  34 y.o.M here to est PCP  This patient gives a history of developing on November 21 right upper extremity and right upper leg weakness and pain after he was pushing a car that was stuck in the mud.  Prior to this he had had onset over 2 months ago some right-sided weakness and tremors in both the right upper and lower extremities.  Notes he is an every other day cocaine user and also marijuana user.  On arrival today blood pressure was 163/116.  Previously had been to urgent care in the emergency room and the concern there was muscle spasticity causing new onset of pain and they felt the patient was neurologically intact so no neuro imaging was performed.  His neurologic presentation was somewhat vague at that visit on November 22.  He was given a prescription for cyclobenzaprine and Naprosyn neither which have improved his symptom complex.  No prior known history of diagnosed hypertension however the patient had elevated blood pressure readings in the past  On arrival blood sugar is 114 A1c was 5.9   Past Medical History:  Diagnosis Date  . Asthma   . Headache      Family History  Problem Relation Age of Onset  . Diabetes Mother   . Hypertension Mother   . Hypertension Father   . Cancer Father   . Colon cancer Father   . Lung cancer Father      Social History   Socioeconomic History  . Marital status: Legally Separated    Spouse name: Not on file  . Number of children: Not on file  . Years of education: Not on file  . Highest education level: Not on file  Occupational History  . Not on file  Tobacco Use  . Smoking status: Current Every Day Smoker    Packs/day: 0.25    Types: Cigarettes  . Smokeless tobacco: Never Used  Vaping Use  . Vaping Use: Former  Substance and Sexual Activity  . Alcohol use: Yes    Alcohol/week: 3.0 standard drinks    Types: 3 Cans  of beer per week    Comment: 3 week  . Drug use: Yes    Types: Marijuana, Cocaine    Comment: rarely uses cocaine  . Sexual activity: Not Currently  Other Topics Concern  . Not on file  Social History Narrative  . Not on file   Social Determinants of Health   Financial Resource Strain:   . Difficulty of Paying Living Expenses: Not on file  Food Insecurity:   . Worried About Programme researcher, broadcasting/film/video in the Last Year: Not on file  . Ran Out of Food in the Last Year: Not on file  Transportation Needs:   . Lack of Transportation (Medical): Not on file  . Lack of Transportation (Non-Medical): Not on file  Physical Activity:   . Days of Exercise per Week: Not on file  . Minutes of Exercise per Session: Not on file  Stress:   . Feeling of Stress : Not on file  Social Connections:   . Frequency of Communication with Friends and Family: Not on file  . Frequency of Social Gatherings with Friends and Family: Not on file  . Attends Religious Services: Not on file  . Active Member of Clubs or Organizations: Not on file  . Attends Club or  Organization Meetings: Not on file  . Marital Status: Not on file  Intimate Partner Violence:   . Fear of Current or Ex-Partner: Not on file  . Emotionally Abused: Not on file  . Physically Abused: Not on file  . Sexually Abused: Not on file     No Known Allergies   Outpatient Medications Prior to Visit  Medication Sig Dispense Refill  . cyclobenzaprine (FLEXERIL) 10 MG tablet Take 1 tablet (10 mg total) by mouth 3 (three) times daily as needed for muscle spasms. DO NOT DRINK ALCOHOL OR DRIVE WHILE TAKING THIS MEDICATION 30 tablet 0  . naproxen (NAPROSYN) 500 MG tablet Take 1 tablet (500 mg total) by mouth 2 (two) times daily as needed. 30 tablet 0   No facility-administered medications prior to visit.      Review of Systems  Constitutional: Positive for fatigue.  HENT: Negative.   Respiratory: Negative.   Cardiovascular: Negative.    Gastrointestinal: Negative.   Genitourinary: Negative.   Musculoskeletal: Positive for gait problem.  Neurological: Positive for dizziness, tremors, weakness, light-headedness and numbness. Negative for seizures, facial asymmetry and headaches.  Psychiatric/Behavioral: Positive for agitation, behavioral problems, confusion and decreased concentration. Negative for self-injury, sleep disturbance and suicidal ideas. The patient is nervous/anxious.        Objective:   Physical Exam   Vitals:   07/06/20 1525  BP: (!) 163/116  Pulse: 95  SpO2: 97%  Weight: 152 lb (68.9 kg)  Height: 5\' 11"  (1.803 m)    Gen: Pleasant, well-nourished, in no distress,  normal affect  ENT: No lesions,  mouth clear,  oropharynx clear, no postnasal drip  Neck: No JVD, no TMG, no carotid bruits  Lungs: No use of accessory muscles, no dullness to percussion, clear without rales or rhonchi  Cardiovascular: RRR, heart sounds normal, no murmur or gallops, no peripheral edema  Abdomen: soft and NT, no HSM,  BS normal  Musculoskeletal: No deformities, no cyanosis or clubbing  Neuro: Patient is weak in the right upper extremity and right lower extremity with resting tremor observed The extraocular muscles are intact.  The patient has difficulty with gait without a cane and falls to the right side.    Skin: Warm, no lesions or rashes  No neurologic imaging studies are available      Assessment & Plan:  I personally reviewed all images and lab data in the [facility] system as well as any outside material available during this office visit and agree with the  radiology impressions.   Tobacco use    . Current smoking consumption amount: 1 pack a day  . Dicsussion on advise to quit smoking and smoking impacts: Cardiovascular impacts  . Patient's willingness to quit: Might be willing to cut back on smoking  . Methods to quit smoking discussed: Behavioral modification nicotine replacement  . Medication  management of smoking session drugs discussed: No medications as of yet to we can work-up the patient's neuro status  . Resources provided:  AVS   . Setting quit date not yet established  . Follow-up arranged 1 week   Time spent counseling the patient: 5 minutes    Cocaine abuse (HCC) Advised to immediately quit cocaine at this time due to the patient's neurologic status  Primary hypertension Begin amlodipine 10 mg daily and losartan 100 mg daily metoprolol 50 mg daily and follow-up closely  Obtain renal function thyroid function blood counts lipid panel  Cerebrovascular accident (CVA) (HCC) In the setting of cocaine use  symptom complex very concerning for potential stroke will obtain MRI of the brain the soonest possible the next 24 hours if his symptoms worsen he is told to go to the emergency room immediately  We will begin Plavix 75 mg daily aspirin 81 mg daily we will obtain echocardiogram obtain neurology referral and urine drug screen   Jordynn was seen today for establish care.  Diagnoses and all orders for this visit:  Cerebrovascular accident (CVA), unspecified mechanism (HCC) -     MR Brain W Wo Contrast -     ECHOCARDIOGRAM COMPLETE; Future -     Ambulatory referral to Neurology -     Basic metabolic panel; Future -     Lipid panel  Screening for diabetes mellitus (DM) -     POCT glucose (manual entry) -     POCT glycosylated hemoglobin (Hb A1C)  Cocaine abuse (HCC) -     MR Brain W Wo Contrast -     ECHOCARDIOGRAM COMPLETE; Future -     Ambulatory referral to Neurology -     [id number]+Oxyco+Alc+Crt-Bund  Primary hypertension -     Thyroid Panel With TSH -     Basic metabolic panel; Future  Tobacco use  Other orders -     clopidogrel (PLAVIX) 75 MG tablet; Take 1 tablet (75 mg total) by mouth daily. -     aspirin EC 81 MG tablet; Take 1 tablet (81 mg total) by mouth daily. Swallow whole. -     amLODipine (NORVASC) 10 MG tablet; Take 1 tablet (10 mg  total) by mouth daily. -     metoprolol succinate (TOPROL-XL) 50 MG 24 hr tablet; Take 1 tablet (50 mg total) by mouth daily. Take with or immediately following a meal. -     losartan (COZAAR) 100 MG tablet; Take 1 tablet (100 mg total) by mouth daily.  I spent about 40 minutes of my time today with this patient going over his records and discussing his condition with him and his fiance

## 2020-07-06 ENCOUNTER — Encounter: Payer: Self-pay | Admitting: Critical Care Medicine

## 2020-07-06 ENCOUNTER — Other Ambulatory Visit: Payer: Self-pay

## 2020-07-06 ENCOUNTER — Other Ambulatory Visit: Payer: Self-pay | Admitting: Critical Care Medicine

## 2020-07-06 ENCOUNTER — Ambulatory Visit: Payer: Self-pay | Attending: Critical Care Medicine | Admitting: Critical Care Medicine

## 2020-07-06 VITALS — BP 163/116 | HR 95 | Ht 71.0 in | Wt 152.0 lb

## 2020-07-06 DIAGNOSIS — F1410 Cocaine abuse, uncomplicated: Secondary | ICD-10-CM

## 2020-07-06 DIAGNOSIS — F1491 Cocaine use, unspecified, in remission: Secondary | ICD-10-CM | POA: Insufficient documentation

## 2020-07-06 DIAGNOSIS — I639 Cerebral infarction, unspecified: Secondary | ICD-10-CM

## 2020-07-06 DIAGNOSIS — I10 Essential (primary) hypertension: Secondary | ICD-10-CM

## 2020-07-06 DIAGNOSIS — Z720 Tobacco use: Secondary | ICD-10-CM

## 2020-07-06 DIAGNOSIS — Z131 Encounter for screening for diabetes mellitus: Secondary | ICD-10-CM

## 2020-07-06 DIAGNOSIS — Z87898 Personal history of other specified conditions: Secondary | ICD-10-CM | POA: Insufficient documentation

## 2020-07-06 LAB — POCT GLYCOSYLATED HEMOGLOBIN (HGB A1C): Hemoglobin A1C: 5.9 % — AB (ref 4.0–5.6)

## 2020-07-06 LAB — GLUCOSE, POCT (MANUAL RESULT ENTRY): POC Glucose: 114 mg/dl — AB (ref 70–99)

## 2020-07-06 MED ORDER — LOSARTAN POTASSIUM 100 MG PO TABS: 100.0000 mg | ORAL_TABLET | Freq: Every day | ORAL | 3 refills | Status: DC

## 2020-07-06 MED ORDER — ASPIRIN EC 81 MG PO TBEC: 81.0000 mg | DELAYED_RELEASE_TABLET | Freq: Every day | ORAL | 11 refills | Status: DC

## 2020-07-06 MED ORDER — METOPROLOL SUCCINATE ER 50 MG PO TB24: 50.0000 mg | ORAL_TABLET | Freq: Every day | ORAL | 3 refills | Status: DC

## 2020-07-06 MED ORDER — AMLODIPINE BESYLATE 10 MG PO TABS: 10.0000 mg | ORAL_TABLET | Freq: Every day | ORAL | 3 refills | Status: DC

## 2020-07-06 MED ORDER — CLOPIDOGREL BISULFATE 75 MG PO TABS: 75.0000 mg | ORAL_TABLET | Freq: Every day | ORAL | 3 refills | Status: DC

## 2020-07-06 MED FILL — AMLODIPINE BESYLATE 10 MG T: 10 | 30 days supply | Qty: 30 | Fill #0

## 2020-07-06 MED FILL — METOPROLOL SUCCINATE ER 50: 50 | 30 days supply | Qty: 30 | Fill #0

## 2020-07-06 MED FILL — CLOPIDOGREL 75 MG TABLET: 75 | 30 days supply | Qty: 30 | Fill #0

## 2020-07-06 MED FILL — LOSARTAN POTASSIUM 100 MG T: 100 | 30 days supply | Qty: 30 | Fill #0

## 2020-07-06 NOTE — Assessment & Plan Note (Signed)
In the setting of cocaine use symptom complex very concerning for potential stroke will obtain MRI of the brain the soonest possible the next 24 hours if his symptoms worsen he is told to go to the emergency room immediately  We will begin Plavix 75 mg daily aspirin 81 mg daily we will obtain echocardiogram obtain neurology referral and urine drug screen

## 2020-07-06 NOTE — Assessment & Plan Note (Signed)
Begin amlodipine 10 mg daily and losartan 100 mg daily metoprolol 50 mg daily and follow-up closely  Obtain renal function thyroid function blood counts lipid panel

## 2020-07-06 NOTE — Progress Notes (Signed)
New patient to [facility]  Right leg pain for past 7 days  Fell on left shoulder pushing a car a month ago  Experiencing trigger finger in both hands  Has not rcvd covid vaccine

## 2020-07-06 NOTE — Assessment & Plan Note (Signed)
Advised to immediately quit cocaine at this time due to the patient's neurologic status

## 2020-07-06 NOTE — Assessment & Plan Note (Signed)
  .   Current smoking consumption amount: 1 pack a day  . Dicsussion on advise to quit smoking and smoking impacts: Cardiovascular impacts  . Patient's willingness to quit: Might be willing to cut back on smoking  . Methods to quit smoking discussed: Behavioral modification nicotine replacement  . Medication management of smoking session drugs discussed: No medications as of yet to we can work-up the patient's neuro status  . Resources provided:  AVS   . Setting quit date not yet established  . Follow-up arranged 1 week   Time spent counseling the patient: 5 minutes

## 2020-07-06 NOTE — Patient Instructions (Addendum)
MRI of the brain will be obtained hopefully today  Referral to mental health will be made at a later date for your cocaine use, drug screen is obtained today, please refrain from further cocaine use  Begin amlodipine, losartan, and metoprolol daily for blood pressure control  Stop Naprosyn and cyclobenzaprine  Begin clopidogrel and aspirin daily for stroke treatment  Use the cane to help with your ambulation  Referral to neurology will be made  Return to see Dr. Delford Field 1 week, note depending on MRI results we may need to admit you to the hospital  Echocardiogram will be scheduled  You need to stop smoking significantly  Do not work at this time stay at home  Follow a healthy diet as outlined below   Managing Your Hypertension Hypertension is commonly called high blood pressure. This is when the force of your blood pressing against the walls of your arteries is too strong. Arteries are blood vessels that carry blood from your heart throughout your body. Hypertension forces the heart to work harder to pump blood, and may cause the arteries to become narrow or stiff. Having untreated or uncontrolled hypertension can cause heart attack, stroke, kidney disease, and other problems. What are blood pressure readings? A blood pressure reading consists of a higher number over a lower number. Ideally, your blood pressure should be below 120/80. The first ("top") number is called the systolic pressure. It is a measure of the pressure in your arteries as your heart beats. The second ("bottom") number is called the diastolic pressure. It is a measure of the pressure in your arteries as the heart relaxes. What does my blood pressure reading mean? Blood pressure is classified into four stages. Based on your blood pressure reading, your health care provider may use the following stages to determine what type of treatment you need, if any. Systolic pressure and diastolic pressure are measured in a unit  called mm Hg. Normal  Systolic pressure: below 120.  Diastolic pressure: below 80. Elevated  Systolic pressure: 120-129.  Diastolic pressure: below 80. Hypertension stage 1  Systolic pressure: 130-139.  Diastolic pressure: 80-89. Hypertension stage 2  Systolic pressure: 140 or above.  Diastolic pressure: 90 or above. What health risks are associated with hypertension? Managing your hypertension is an important responsibility. Uncontrolled hypertension can lead to:  A heart attack.  A stroke.  A weakened blood vessel (aneurysm).  Heart failure.  Kidney damage.  Eye damage.  Metabolic syndrome.  Memory and concentration problems. What changes can I make to manage my hypertension? Hypertension can be managed by making lifestyle changes and possibly by taking medicines. Your health care provider will help you make a plan to bring your blood pressure within a normal range. Eating and drinking   Eat a diet that is high in fiber and potassium, and low in salt (sodium), added sugar, and fat. An example eating plan is called the DASH (Dietary Approaches to Stop Hypertension) diet. To eat this way: ? Eat plenty of fresh fruits and vegetables. Try to fill half of your plate at each meal with fruits and vegetables. ? Eat whole grains, such as whole wheat pasta, brown rice, or whole grain bread. Fill about one quarter of your plate with whole grains. ? Eat low-fat diary products. ? Avoid fatty cuts of meat, processed or cured meats, and poultry with skin. Fill about one quarter of your plate with lean proteins such as fish, chicken without skin, beans, eggs, and tofu. ? Avoid premade  and processed foods. These tend to be higher in sodium, added sugar, and fat.  Reduce your daily sodium intake. Most people with hypertension should eat less than 1,500 mg of sodium a day.  Limit alcohol intake to no more than 1 drink a day for nonpregnant women and 2 drinks a day for men. One  drink equals 12 oz of beer, 5 oz of wine, or 1 oz of hard liquor. Lifestyle  Work with your health care provider to maintain a healthy body weight, or to lose weight. Ask what an ideal weight is for you.  Get at least 30 minutes of exercise that causes your heart to beat faster (aerobic exercise) most days of the week. Activities may include walking, swimming, or biking.  Include exercise to strengthen your muscles (resistance exercise), such as weight lifting, as part of your weekly exercise routine. Try to do these types of exercises for 30 minutes at least 3 days a week.  Do not use any products that contain nicotine or tobacco, such as cigarettes and e-cigarettes. If you need help quitting, ask your health care provider.  Control any long-term (chronic) conditions you have, such as high cholesterol or diabetes. Monitoring  Monitor your blood pressure at home as told by your health care provider. Your personal target blood pressure may vary depending on your medical conditions, your age, and other factors.  Have your blood pressure checked regularly, as often as told by your health care provider. Working with your health care provider  Review all the medicines you take with your health care provider because there may be side effects or interactions.  Talk with your health care provider about your diet, exercise habits, and other lifestyle factors that may be contributing to hypertension.  Visit your health care provider regularly. Your health care provider can help you create and adjust your plan for managing hypertension. Will I need medicine to control my blood pressure? Your health care provider may prescribe medicine if lifestyle changes are not enough to get your blood pressure under control, and if:  Your systolic blood pressure is 130 or higher.  Your diastolic blood pressure is 80 or higher. Take medicines only as told by your health care provider. Follow the directions  carefully. Blood pressure medicines must be taken as prescribed. The medicine does not work as well when you skip doses. Skipping doses also puts you at risk for problems. Contact a health care provider if:  You think you are having a reaction to medicines you have taken.  You have repeated (recurrent) headaches.  You feel dizzy.  You have swelling in your ankles.  You have trouble with your vision. Get help right away if:  You develop a severe headache or confusion.  You have unusual weakness or numbness, or you feel faint.  You have severe pain in your chest or abdomen.  You vomit repeatedly.  You have trouble breathing. Summary  Hypertension is when the force of blood pumping through your arteries is too strong. If this condition is not controlled, it may put you at risk for serious complications.  Your personal target blood pressure may vary depending on your medical conditions, your age, and other factors. For most people, a normal blood pressure is less than 120/80.  Hypertension is managed by lifestyle changes, medicines, or both. Lifestyle changes include weight loss, eating a healthy, low-sodium diet, exercising more, and limiting alcohol. This information is not intended to replace advice given to you by your health  care provider. Make sure you discuss any questions you have with your health care provider. Document Revised: 11/16/2018 Document Reviewed: 06/22/2016 Elsevier Patient Education  2020 [employer].

## 2020-07-07 ENCOUNTER — Ambulatory Visit (HOSPITAL_COMMUNITY)
Admission: RE | Admit: 2020-07-07 | Discharge: 2020-07-07 | Disposition: A | Discharge status: Home / Self Care | Payer: Self-pay | Source: Ambulatory Visit | Attending: Critical Care Medicine | Admitting: Critical Care Medicine

## 2020-07-07 ENCOUNTER — Ambulatory Visit (HOSPITAL_COMMUNITY): Admission: RE | Admit: 2020-07-07 | Payer: Self-pay | Source: Ambulatory Visit

## 2020-07-07 DIAGNOSIS — I639 Cerebral infarction, unspecified: Secondary | ICD-10-CM | POA: Insufficient documentation

## 2020-07-07 DIAGNOSIS — F1410 Cocaine abuse, uncomplicated: Secondary | ICD-10-CM | POA: Insufficient documentation

## 2020-07-07 MED ORDER — GADOBUTROL 1 MMOL/ML IV SOLN
8.0000 mL | Freq: Once | INTRAVENOUS | Status: AC | PRN
  Administered 2020-07-07: 8 mL via INTRAVENOUS

## 2020-07-08 ENCOUNTER — Encounter: Payer: Self-pay | Admitting: Neurology

## 2020-07-08 ENCOUNTER — Telehealth: Payer: Self-pay | Admitting: Critical Care Medicine

## 2020-07-08 DIAGNOSIS — F1410 Cocaine abuse, uncomplicated: Secondary | ICD-10-CM

## 2020-07-08 DIAGNOSIS — R531 Weakness: Secondary | ICD-10-CM

## 2020-07-08 DIAGNOSIS — I739 Peripheral vascular disease, unspecified: Secondary | ICD-10-CM | POA: Insufficient documentation

## 2020-07-08 NOTE — Telephone Encounter (Signed)
Pt is aware of MRI result  He needs Neuro consult, ordered  Also I told him to stay on ASA but HOLD plavix for now  Also he needs appt with jasmine for anxiety and cocaine use and connect him to subst use services  Such as [facility]

## 2020-07-13 LAB — THYROID PANEL WITH TSH
Free Thyroxine Index: 2.2 (ref 1.2–4.9)
T3 Uptake Ratio: 29 % (ref 24–39)
T4, Total: 7.6 ug/dL (ref 4.5–12.0)
TSH: 1.24 u[IU]/mL (ref 0.450–4.500)

## 2020-07-13 LAB — DRUG SCREEN 764883 11+OXYCO+ALC+CRT-BUND
Amphetamines, Urine: NEGATIVE ng/mL
BENZODIAZ UR QL: NEGATIVE ng/mL
Barbiturate: NEGATIVE ng/mL
Creatinine: 138.2 mg/dL (ref 20.0–300.0)
Ethanol: NEGATIVE %
Meperidine: NEGATIVE ng/mL
Methadone Screen, Urine: NEGATIVE ng/mL
OPIATE SCREEN URINE: NEGATIVE ng/mL
Oxycodone/Oxymorphone, Urine: NEGATIVE ng/mL
Phencyclidine: NEGATIVE ng/mL
Propoxyphene: NEGATIVE ng/mL
Tramadol: NEGATIVE ng/mL
pH, Urine: 5.6 (ref 4.5–8.9)

## 2020-07-13 LAB — LIPID PANEL
Chol/HDL Ratio: 2.7 ratio (ref 0.0–5.0)
Cholesterol, Total: 171 mg/dL (ref 100–199)
HDL: 63 mg/dL (ref >39)
LDL Chol Calc (NIH): 91 mg/dL (ref 0–99)
Triglycerides: 93 mg/dL (ref 0–149)
VLDL Cholesterol Cal: 17 mg/dL (ref 5–40)

## 2020-07-13 LAB — COCAINE CONF, UR
Benzoylecgonine GC/MS Conf: 9840 ng/mL
Cocaine Metab Quant, Ur: POSITIVE — AB

## 2020-07-13 LAB — CANNABINOID CONFIRMATION, UR
CANNABINOIDS: POSITIVE — AB
Carboxy THC GC/MS Conf: 163 ng/mL

## 2020-07-16 ENCOUNTER — Ambulatory Visit: Payer: Self-pay | Attending: Critical Care Medicine | Admitting: Critical Care Medicine

## 2020-07-16 ENCOUNTER — Other Ambulatory Visit: Payer: Self-pay

## 2020-07-16 ENCOUNTER — Encounter: Payer: Self-pay | Admitting: Critical Care Medicine

## 2020-07-16 ENCOUNTER — Other Ambulatory Visit: Payer: Self-pay | Admitting: Critical Care Medicine

## 2020-07-16 DIAGNOSIS — I739 Peripheral vascular disease, unspecified: Secondary | ICD-10-CM

## 2020-07-16 DIAGNOSIS — F1410 Cocaine abuse, uncomplicated: Secondary | ICD-10-CM

## 2020-07-16 DIAGNOSIS — I10 Essential (primary) hypertension: Secondary | ICD-10-CM

## 2020-07-16 DIAGNOSIS — Z720 Tobacco use: Secondary | ICD-10-CM

## 2020-07-16 MED ORDER — METHOCARBAMOL 500 MG PO TABS: 500.0000 mg | ORAL_TABLET | Freq: Four times a day (QID) | ORAL | 1 refills | Status: DC | PRN

## 2020-07-16 NOTE — Assessment & Plan Note (Signed)
Patient will continue current medications and will obtain a blood pressure cuff again advise how he can obtain 1

## 2020-07-16 NOTE — Assessment & Plan Note (Signed)
    Current smoking consumption amount: 5 cigarettes a day . Dicsussion on advise to quit smoking and smoking impacts: Cardiovascular impacts  . Patient's willingness to quit: Might be willing to cut back on smoking  . Methods to quit smoking discussed: Behavioral modification nicotine replacement  . Medication management of smoking session drugs discussed: No medications as of yet to we can work-up the patient's neuro status  . Resources provided:  AVS   . Setting quit date not yet established  . Follow-up arranged 1 week   Time spent counseling the patient: 1 month

## 2020-07-16 NOTE — Assessment & Plan Note (Signed)
Patient asked to keep his appointment with neurology that is upcoming on December 14 We will begin Robaxin every 6 hours as needed for muscle spasm  For the tremors I will await neurology evaluations

## 2020-07-16 NOTE — Assessment & Plan Note (Signed)
History of cocaine abuse currently not using cocaine but I will connect him with licensed clinical social worker

## 2020-07-16 NOTE — Progress Notes (Signed)
Patient ID: Jon Nolan, male    DOB: 02/04/1986, 34 y.o.   MRN: [id number] Virtual Visit via Video Note  I connected with@ on 07/16/20 at@ by a video enabled telemedicine application and verified that I am speaking with the correct person using two identifiers.   Consent:  I discussed the limitations, risks, security and privacy concerns of performing an evaluation and management service by video visit and the availability of in person appointments. I also discussed with the patient that there may be a patient responsible charge related to this service. The patient expressed understanding and agreed to proceed.  Location of patient: Patient is at home  Location of provider: I am in my office  Persons participating in the televisit with the patient.   No one else on the call    History of Present Illness:  07/06/2020  34 y.o.M here to est PCP  This patient gives a history of developing on November 21 right upper extremity and right upper leg weakness and pain after he was pushing a car that was stuck in the mud.  Prior to this he had had onset over 2 months ago some right-sided weakness and tremors in both the right upper and lower extremities.  Notes he is an every other day cocaine user and also marijuana user.  On arrival today blood pressure was 163/116.  Previously had been to urgent care in the emergency room and the concern there was muscle spasticity causing new onset of pain and they felt the patient was neurologically intact so no neuro imaging was performed.  His neurologic presentation was somewhat vague at that visit on November 22.  He was given a prescription for cyclobenzaprine and Naprosyn neither which have improved his symptom complex.  No prior known history of diagnosed hypertension however the patient had elevated blood pressure readings in the past  On arrival blood sugar is 114 A1c was 5.9  07/16/2020 Patient is seen in return follow-up doing well the  patient no longer is using cocaine he is down to 3 to 5 cigarettes daily he does occasionally use marijuana every few days he remains somewhat anxious and nervous he states his tremors and muscle spasms are still persisting the patient's MRI did not show stroke but did show white matter changes which will need further follow-up he does not have a measure month of blood pressure at home he is compliant with all his medications We did have him stay on his aspirin but stop the Plavix  Past Medical History:  Diagnosis Date  . Asthma   . Headache      Family History  Problem Relation Age of Onset  . Diabetes Mother   . Hypertension Mother   . Hypertension Father   . Cancer Father   . Colon cancer Father   . Lung cancer Father      Social History   Socioeconomic History  . Marital status: Legally Separated    Spouse name: Not on file  . Number of children: Not on file  . Years of education: Not on file  . Highest education level: Not on file  Occupational History  . Not on file  Tobacco Use  . Smoking status: Current Every Day Smoker    Packs/day: 0.25    Types: Cigarettes  . Smokeless tobacco: Never Used  Vaping Use  . Vaping Use: Former  Substance and Sexual Activity  . Alcohol use: Yes    Alcohol/week: 3.0 standard drinks  Types: 3 Cans of beer per week    Comment: 3 week  . Drug use: Yes    Types: Marijuana, Cocaine    Comment: rarely uses cocaine  . Sexual activity: Not Currently  Other Topics Concern  . Not on file  Social History Narrative  . Not on file   Social Determinants of Health   Financial Resource Strain: Not on file  Food Insecurity: Not on file  Transportation Needs: Not on file  Physical Activity: Not on file  Stress: Not on file  Social Connections: Not on file  Intimate Partner Violence: Not on file     No Known Allergies   Outpatient Medications Prior to Visit  Medication Sig Dispense Refill  . amLODipine (NORVASC) 10 MG tablet Take 1  tablet (10 mg total) by mouth daily. 90 tablet 3  . aspirin EC 81 MG tablet Take 1 tablet (81 mg total) by mouth daily. Swallow whole. 30 tablet 11  . losartan (COZAAR) 100 MG tablet Take 1 tablet (100 mg total) by mouth daily. 90 tablet 3  . metoprolol succinate (TOPROL-XL) 50 MG 24 hr tablet Take 1 tablet (50 mg total) by mouth daily. Take with or immediately following a meal. 90 tablet 3  . clopidogrel (PLAVIX) 75 MG tablet Take 1 tablet (75 mg total) by mouth daily. (Patient not taking: Reported on 07/16/2020) 90 tablet 3   No facility-administered medications prior to visit.      Review of Systems  Constitutional: Positive for fatigue.  HENT: Negative.   Respiratory: Negative.   Cardiovascular: Negative.   Gastrointestinal: Negative.   Genitourinary: Negative.   Musculoskeletal: Positive for gait problem.  Neurological: Positive for weakness and light-headedness. Negative for dizziness, tremors, seizures, facial asymmetry, numbness and headaches.  Psychiatric/Behavioral: Negative for agitation, behavioral problems, confusion, decreased concentration, self-injury, sleep disturbance and suicidal ideas. The patient is nervous/anxious.        Objective:   Physical Exam   There were no vitals filed for this visit. This is a video visit no physical exam patient appears normal much more awake and alert on the video    Assessment & Plan:  I personally reviewed all images and lab data in the [facility] system as well as any outside material available during this office visit and agree with the  radiology impressions.   Primary hypertension Patient will continue current medications and will obtain a blood pressure cuff again advise how he can obtain 1  Small vessel disease [facility]) Patient asked to keep his appointment with neurology that is upcoming on December 14 We will begin Robaxin every 6 hours as needed for muscle spasm  For the tremors I will await neurology evaluations  Cocaine  abuse (HCC) History of cocaine abuse currently not using cocaine but I will connect him with licensed clinical social worker  Tobacco use    Current smoking consumption amount: 5 cigarettes a day . Dicsussion on advise to quit smoking and smoking impacts: Cardiovascular impacts  . Patient's willingness to quit: Might be willing to cut back on smoking  . Methods to quit smoking discussed: Behavioral modification nicotine replacement  . Medication management of smoking session drugs discussed: No medications as of yet to we can work-up the patient's neuro status  . Resources provided:  AVS   . Setting quit date not yet established  . Follow-up arranged 1 week   Time spent counseling the patient: 1 month    Jon Nolan was seen today for follow-up.  Diagnoses  and all orders for this visit:  Primary hypertension  Small vessel disease (HCC)  Cocaine abuse (HCC)  Tobacco use     Follow Up Instructions:   Patient return in 2 months with face-to-face visit Patient knows he will have an appointment with our licensed clinical social worker for behavioral therapy I discussed the assessment and treatment plan with the patient. The patient was provided an opportunity to ask questions and all were answered. The patient agreed with the plan and demonstrated an understanding of the instructions.   The patient was advised to call back or seek an in-person evaluation if the symptoms worsen or if the condition fails to improve as anticipated.  I provided 30 minutes of non-face-to-face time during this encounter  including  median intraservice time , review of notes, labs, imaging, medications  and explaining diagnosis and management to the patient .    Shan Levans, MD

## 2020-07-17 MED FILL — METHOCARBAMOL 500 MG TABS: 500 | 30 days supply | Qty: 90 | Fill #0

## 2020-07-20 NOTE — Progress Notes (Signed)
NEUROLOGY CONSULTATION NOTE  KENNIS Nolan MRN: [id number] DOB: 1986-07-30  Referring provider: Shan Levans, MD Primary care provider: Shan Levans, MD  Reason for consult:  CVA   Subjective:  Jon Nolan is a 34 year old right-handed male who presents for right upper and lower extremity pain and weakness.  History supplemented by ED and referring provider's notes.  Beginning around March 2021, he was pushing his car out of the mud when he slipped and fell on his left shoulder.  Once he was on the ground, he wasn't able to move his body for 10 to 15 minutes.  He went to Urgent Care for his left shoulder pain and was given a steroid injection.  Afterwards, he started having gradually progressive weakness in the legs, right worse than left.  Sometimes his legs will spontaneously start shaking. He endorses left sided upper and lower back pain with sharp pain radiating down the posterior left leg to the knee.  He then developed numbness and tingling in his hands, making it difficulty to hold or carry objects.  He has had progressively worsening gait and has had several falls.  He now requires a cane to help with walking.  He reports increased difficulty having a bowel movement.  Due to worsening symptoms, he went to the ED on 06/25/2020 where X-ray of lumbar spine was negative.  He went to Urgent Care on 06/29/2020 due to new low back and right leg pain.  He was diagnosed with muscle spasms and discharged with naproxen and Flexeril.  Due to suspected stroke, he was started on Plavix and had an outpatient MRI of brain with and without contrast on 07/07/2020 which was personally reviewed and showed mild scattered punctate T2/FLAIR hyperintense foci within the cerebral white matter but no acute stroke.  He was advised to discontinue Plavix but take ASA.  He does have history of marijuana, cocaine and cigarette use.  Labs in November include:  B12 272, TSH 0.456, Hgb A1c 5.9, LDL 91, positive  cannabinoids, positive cocaine.    He has history of multiple concussions related to football as a teenager.  He also has history of migraines.   No family history of neurological disorders.   PAST MEDICAL HISTORY: Past Medical History:  Diagnosis Date   Asthma    Headache     PAST SURGICAL HISTORY: No past surgical history on file.  MEDICATIONS: Current Outpatient Medications on File Prior to Visit  Medication Sig Dispense Refill   amLODipine (NORVASC) 10 MG tablet Take 1 tablet (10 mg total) by mouth daily. 90 tablet 3   aspirin EC 81 MG tablet Take 1 tablet (81 mg total) by mouth daily. Swallow whole. 30 tablet 11   clopidogrel (PLAVIX) 75 MG tablet Take 1 tablet (75 mg total) by mouth daily. (Patient not taking: Reported on 07/16/2020) 90 tablet 3   losartan (COZAAR) 100 MG tablet Take 1 tablet (100 mg total) by mouth daily. 90 tablet 3   methocarbamol (ROBAXIN) 500 MG tablet Take 1 tablet (500 mg total) by mouth every 6 (six) hours as needed for muscle spasms. 90 tablet 1   metoprolol succinate (TOPROL-XL) 50 MG 24 hr tablet Take 1 tablet (50 mg total) by mouth daily. Take with or immediately following a meal. 90 tablet 3   No current facility-administered medications on file prior to visit.    ALLERGIES: No Known Allergies  FAMILY HISTORY: Family History  Problem Relation Age of Onset   Diabetes Mother  Hypertension Mother    Hypertension Father    Cancer Father    Colon cancer Father    Lung cancer Father    SOCIAL HISTORY: Social History   Socioeconomic History   Marital status: Legally Separated    Spouse name: Not on file   Number of children: Not on file   Years of education: Not on file   Highest education level: Not on file  Occupational History   Not on file  Tobacco Use   Smoking status: Current Every Day Smoker    Packs/day: 0.25    Types: Cigarettes   Smokeless tobacco: Never Used  Vaping Use   Vaping Use: Former   Substance and Sexual Activity   Alcohol use: Yes    Alcohol/week: 3.0 standard drinks    Types: 3 Cans of beer per week    Comment: 3 week   Drug use: Yes    Types: Marijuana, Cocaine    Comment: rarely uses cocaine   Sexual activity: Not Currently  Other Topics Concern   Not on file  Social History Narrative   Not on file   Social Determinants of Health   Financial Resource Strain: Not on file  Food Insecurity: Not on file  Transportation Needs: Not on file  Physical Activity: Not on file  Stress: Not on file  Social Connections: Not on file  Intimate Partner Violence: Not on file    Objective:  Resp. rate 20, height 5\' 11"  (1.803 m), weight 152 lb (68.9 kg). General: No acute distress.  Patient appears well-groomed.   Head:  Normocephalic/atraumatic Eyes:  fundi examined but not visualized Neck: supple, no paraspinal tenderness, full range of motion Back: No paraspinal tenderness Heart: regular rate and rhythm Lungs: Clear to auscultation bilaterally. Vascular: No carotid bruits. Neurological Exam: Mental status: alert and oriented to person, place, and time, recent and remote memory intact, fund of knowledge intact, attention and concentration intact, speech fluent and not dysarthric, language intact. Cranial nerves: CN I: not tested CN II: pupils equal, round and reactive to light, visual fields intact CN III, IV, VI:  full range of motion, no nystagmus, no ptosis CN V: facial sensation intact. CN VII: upper and lower face symmetric CN VIII: hearing intact CN IX, X: gag intact, uvula midline CN XI: sternocleidomastoid and trapezius muscles intact CN XII: tongue midline Bulk & Tone: increased tone in lower extremities, no fasciculations. Motor:  muscle strength 5/5 throughout Sensation:  Pinprick sensation reduced in hands and feet.  Vibratory sensation reduced in lower extremities. Deep Tendon Reflexes:  3+ throughout, nonsustained clonus in ankles,  bilateral Hoffman, toes downgoing.   Finger to nose testing:  Without dysmetria.   Heel to shin:  Without dysmetria.   Gait:  Antalgic gait.    Assessment/Plan:   Right sided pain and weakness.  Not a stroke.  However, I am concerned about a myelopathy as he exhibits hyperreflexia with Hoffman sign and nonsustained clonus in the the ankles, as well as sensory deficits and unsteady gait.  There may be a component of right lumbosacral radiculopathy.  Could he have a disc protrusion that has compressed the spinal cord in the neck when he fell?  The abnormal white matter findings in the brain are nonspecific and can easily be explained by his history of smoking and migraines.  However, would still consider demyelinating disease and therefore would evaluate for any lesion in the spinal cord.  1.  Will need MRI of cervical/thoracic/lumbar spine with and  without contrast 2.  Further recommendations pending results.  Thank you for allowing me to take part in the care of this patient.  Shon Millet, DO  CC: Shan Levans, MD

## 2020-07-21 ENCOUNTER — Encounter: Payer: Self-pay | Admitting: Neurology

## 2020-07-21 ENCOUNTER — Telehealth: Payer: Self-pay | Admitting: Critical Care Medicine

## 2020-07-21 ENCOUNTER — Ambulatory Visit (INDEPENDENT_AMBULATORY_CARE_PROVIDER_SITE_OTHER): Payer: Self-pay | Admitting: Neurology

## 2020-07-21 ENCOUNTER — Other Ambulatory Visit: Payer: Self-pay

## 2020-07-21 ENCOUNTER — Ambulatory Visit: Payer: Self-pay | Admitting: *Deleted

## 2020-07-21 VITALS — Resp 20 | Ht 71.0 in | Wt 152.0 lb

## 2020-07-21 DIAGNOSIS — R200 Anesthesia of skin: Secondary | ICD-10-CM

## 2020-07-21 DIAGNOSIS — R292 Abnormal reflex: Secondary | ICD-10-CM

## 2020-07-21 DIAGNOSIS — R9082 White matter disease, unspecified: Secondary | ICD-10-CM

## 2020-07-21 DIAGNOSIS — R29898 Other symptoms and signs involving the musculoskeletal system: Secondary | ICD-10-CM

## 2020-07-21 NOTE — Telephone Encounter (Signed)
No change in BP meds  Monitor twice a day the BP

## 2020-07-21 NOTE — Patient Instructions (Addendum)
1.  Will get MRI of cervical/thoracic/lumbar spine with and without contrast 2.  Further recommendations pending results.  We have sent a referral to [facility]  for your MRI and they will call you directly to schedule your appointment. They are located at [address] [facility]. If you need to contact them directly please call [phone].

## 2020-07-21 NOTE — Telephone Encounter (Signed)
Pt called in wanting to know how often to monitor his BP.   He bough a new BP machine since Dr. Delford Field started him on BP medication.  See notes below.   A few of his BP readings are included.  He also wanted Dr. Delford Field to know he is seeing the neurologist today at 12:50.  I forwarded this note to Dr. Delford Field at [facility].  Reason for Disposition  [1] Prescription refill request for NON-ESSENTIAL medicine (i.e., no harm to patient if med not taken) AND [2] triager unable to refill per department policy    How often to monitor his BP  Answer Assessment - Initial Assessment Questions 1. NAME of MEDICATION: "What medicine are you calling about?"     I already talked with Dr. Delford Field..    He wants me to monitor my BP.   I bought a BP machine and need to know how often to take my BP. 2. QUESTION: "What is your question?" (e.g., medication refill, side effect)     How often does Dr. Delford Field want me to take my BP? 3. PRESCRIBING HCP: "Who prescribed it?" Reason: if prescribed by specialist, call should be referred to that group.     Dr. Delford Field 4. SYMPTOMS: "Do you have any symptoms?"     No My readings:  12/12 147/93, P 90                        12/13 159/112, P 101                         12/13 124/97, P 88 and checked it again on the 13th it was 119/79, P 89. 5. SEVERITY: If symptoms are present, ask "Are they mild, moderate or severe?"     No symptoms. Let Dr. Delford Field know I'm seeing the neurologist today at 12:50. 6. PREGNANCY:  "Is there any chance that you are pregnant?" "When was your last menstrual period?"     N/A  Protocols used: MEDICATION QUESTION CALL-A-AH

## 2020-07-21 NOTE — Telephone Encounter (Signed)
Called pt made aware of MD instructions. Verbalized understanding 

## 2020-08-13 ENCOUNTER — Ambulatory Visit (HOSPITAL_COMMUNITY)
Admission: RE | Admit: 2020-08-13 | Discharge: 2020-08-13 | Disposition: A | Discharge status: Home / Self Care | Payer: Self-pay | Source: Ambulatory Visit | Attending: Neurology | Admitting: Neurology

## 2020-08-13 ENCOUNTER — Other Ambulatory Visit: Payer: Self-pay

## 2020-08-13 ENCOUNTER — Ambulatory Visit (HOSPITAL_COMMUNITY): Admission: RE | Admit: 2020-08-13 | Payer: Self-pay | Source: Ambulatory Visit

## 2020-08-13 DIAGNOSIS — R292 Abnormal reflex: Secondary | ICD-10-CM | POA: Insufficient documentation

## 2020-08-13 DIAGNOSIS — R9082 White matter disease, unspecified: Secondary | ICD-10-CM | POA: Insufficient documentation

## 2020-08-13 DIAGNOSIS — R29898 Other symptoms and signs involving the musculoskeletal system: Secondary | ICD-10-CM | POA: Insufficient documentation

## 2020-08-13 DIAGNOSIS — R200 Anesthesia of skin: Secondary | ICD-10-CM | POA: Insufficient documentation

## 2020-08-13 MED ORDER — GADOBUTROL 1 MMOL/ML IV SOLN
7.0000 mL | Freq: Once | INTRAVENOUS | Status: AC | PRN
  Administered 2020-08-13: 7 mL via INTRAVENOUS

## 2020-08-14 ENCOUNTER — Telehealth: Payer: Self-pay | Admitting: Critical Care Medicine

## 2020-08-14 ENCOUNTER — Telehealth: Payer: Self-pay

## 2020-08-14 DIAGNOSIS — G9520 Unspecified cord compression: Secondary | ICD-10-CM

## 2020-08-14 DIAGNOSIS — R9082 White matter disease, unspecified: Secondary | ICD-10-CM

## 2020-08-14 DIAGNOSIS — G729 Myopathy, unspecified: Secondary | ICD-10-CM

## 2020-08-14 DIAGNOSIS — M5000 Cervical disc disorder with myelopathy, unspecified cervical region: Secondary | ICD-10-CM

## 2020-08-14 MED ORDER — OXYCODONE-ACETAMINOPHEN 10-325 MG PO TABS
1.0000 | ORAL_TABLET | Freq: Four times a day (QID) | ORAL | 0 refills | Status: DC | PRN
Start: 2020-08-14 — End: 2020-08-24

## 2020-08-14 MED ORDER — PREDNISONE 10 MG PO TABS: ORAL_TABLET | ORAL | 0 refills | Status: DC

## 2020-08-14 NOTE — Telephone Encounter (Signed)
Pt had MRI of spine and has severe cervical spinal stenosis and needs surgery on spine  He has Medicaid pending.  Plan referral to Dr Annell [facility]ing [facility] ASAP  Will call in pain medications  Very appreciative of Dr Moises Blood help

## 2020-08-14 NOTE — Telephone Encounter (Signed)
Pt advised of his Mri results and the referral to Neurosurgery.   Order added

## 2020-08-14 NOTE — Telephone Encounter (Signed)
Pt requesting urgent pain medication for bulging disc pressing into the spine found by the recent MRI done.  Currently been taking medications prescribed,for muscle spams, but not helping. Please advise and thank you.   Pt using [facility]

## 2020-08-14 NOTE — Addendum Note (Signed)
Addended by: Shan Levans E on: 08/14/2020 07:45 PM   Modules accepted: Orders

## 2020-08-14 NOTE — Progress Notes (Signed)
Tried calling pt, No answer LMOVM for pt to call the office in regards to his MRI results.

## 2020-08-14 NOTE — Telephone Encounter (Signed)
LVM for patient to return call.  Would recommend patient speak to the provider that ordered the MRI to request pain medication.   Please advise otherwise

## 2020-08-17 NOTE — Telephone Encounter (Signed)
Patient aware- he states he has started medication already and reminded of appt. 08/20/2020

## 2020-08-20 ENCOUNTER — Other Ambulatory Visit: Payer: Self-pay

## 2020-08-20 ENCOUNTER — Ambulatory Visit: Payer: Self-pay | Admitting: Critical Care Medicine

## 2020-08-20 ENCOUNTER — Ambulatory Visit: Payer: Self-pay | Attending: Critical Care Medicine | Admitting: Critical Care Medicine

## 2020-08-20 ENCOUNTER — Encounter: Payer: Self-pay | Admitting: Critical Care Medicine

## 2020-08-20 ENCOUNTER — Other Ambulatory Visit: Payer: Self-pay | Admitting: Critical Care Medicine

## 2020-08-20 VITALS — BP 123/77 | HR 66 | Temp 97.9°F | Resp 16 | Ht 71.0 in | Wt 162.0 lb

## 2020-08-20 DIAGNOSIS — G894 Chronic pain syndrome: Secondary | ICD-10-CM

## 2020-08-20 DIAGNOSIS — Z1159 Encounter for screening for other viral diseases: Secondary | ICD-10-CM

## 2020-08-20 DIAGNOSIS — Z720 Tobacco use: Secondary | ICD-10-CM

## 2020-08-20 DIAGNOSIS — Z87898 Personal history of other specified conditions: Secondary | ICD-10-CM

## 2020-08-20 DIAGNOSIS — M5412 Radiculopathy, cervical region: Secondary | ICD-10-CM

## 2020-08-20 DIAGNOSIS — Z114 Encounter for screening for human immunodeficiency virus [HIV]: Secondary | ICD-10-CM

## 2020-08-20 DIAGNOSIS — I10 Essential (primary) hypertension: Secondary | ICD-10-CM

## 2020-08-20 DIAGNOSIS — M5000 Cervical disc disorder with myelopathy, unspecified cervical region: Secondary | ICD-10-CM

## 2020-08-20 DIAGNOSIS — F1491 Cocaine use, unspecified, in remission: Secondary | ICD-10-CM

## 2020-08-20 HISTORY — DX: Chronic pain syndrome: G89.4

## 2020-08-20 HISTORY — DX: Cervical disc disorder with myelopathy, unspecified cervical region: M50.00

## 2020-08-20 MED ORDER — VALSARTAN-HYDROCHLOROTHIAZIDE 80-12.5 MG PO TABS: 1.0000 | ORAL_TABLET | Freq: Every day | ORAL | 1 refills | Status: DC

## 2020-08-20 MED ORDER — AMLODIPINE BESYLATE 10 MG PO TABS
10.0000 mg | ORAL_TABLET | Freq: Every day | ORAL | 3 refills | Status: DC
Start: 2020-08-20 — End: 2020-08-25

## 2020-08-20 MED FILL — AMLODIPINE BESYLATE 10 MG T: 10 | 30 days supply | Qty: 30 | Fill #0

## 2020-08-20 MED FILL — VALSARTAN-HCTZ 80-12.5 MG T: 80-12.5 | 30 days supply | Qty: 30 | Fill #0

## 2020-08-20 NOTE — Patient Instructions (Signed)
Stop aspirin and metoprolol Stop losartan  Continue amlodipine daily Begin valsartan HCT 1 daily Both of the above medications are for blood pressure  Continue your pain medicine as needed sign up for my chart to be able to message me when you are running out We are obtaining a urine drug screen today to see which her current status is and I understand and know you are taking over-the-counter CBD  We will have you sign a pain management contract today since I am prescribing opiates until you get your neck surgery  Keep your appointment with Dr. Ophelia Charter upcoming  Blood work today will include hepatitis C and HIV as a population health screen and as mentioned the urine drug screen  Return to see Dr. Delford Field 1 month

## 2020-08-20 NOTE — Assessment & Plan Note (Signed)
    Current smoking consumption amount: 5 cigarettes a day . Dicsussion on advise to quit smoking and smoking impacts: Cardiovascular impacts  . Patient's willingness to quit: Might be willing to cut back on smoking  . Methods to quit smoking discussed: Behavioral modification nicotine replacement  . Medication management of smoking session drugs discussed: No medications as of yet to we can work-up the patient's neuro status  . Resources provided:  AVS   . Setting quit date not yet established  . Follow-up arranged 17month   Time spent counseling the patient: 1 month

## 2020-08-20 NOTE — Assessment & Plan Note (Signed)
Recent MRI shows severe cervical disc disease with myelopathy  Plan referral to Dr. Annell Greening of [facility] for consideration of surgery continue opiate therapy patient signed chronic pain management contract and will obtain a drug screen at this visit  [facility] drug database reviewed no other prescribers seen

## 2020-08-20 NOTE — Assessment & Plan Note (Signed)
Plan to continue chronic opiate therapy

## 2020-08-20 NOTE — Assessment & Plan Note (Signed)
HomePatient's been out of his blood pressure medicines for several days but pressure readings while on medication show he is well controlled so we will refill his amlodipine and continue valsartan HCT

## 2020-08-20 NOTE — Assessment & Plan Note (Signed)
Not actively using

## 2020-08-20 NOTE — Progress Notes (Signed)
Patient ID: Jon Nolan, male    DOB: 05-14-1986, 35 y.o.   MRN: [id number]   History of present illness:  07/06/2020  35 y.o.M here to est PCP  This patient gives a history of developing on November 21 right upper extremity and right upper leg weakness and pain after he was pushing a car that was stuck in the mud.  Prior to this he had had onset over 2 months ago some right-sided weakness and tremors in both the right upper and lower extremities.  Notes he is an every other day cocaine user and also marijuana user.  On arrival today blood pressure was 163/116.  Previously had been to urgent care in the emergency room and the concern there was muscle spasticity causing new onset of pain and they felt the patient was neurologically intact so no neuro imaging was performed.  His neurologic presentation was somewhat vague at that visit on November 22.  He was given a prescription for cyclobenzaprine and Naprosyn neither which have improved his symptom complex.  No prior known history of diagnosed hypertension however the patient had elevated blood pressure readings in the past  On arrival blood sugar is 114 A1c was 5.9  08/20/2020 Neuro w/u:  Severe C spine disease  This patient is seen in return follow-up and was initially assessed in November thought to have acute stroke MRI of the brain was negative he continued to have weakness in the right arm and leg and some pain as well.  This patient underwent went further evaluation after neurology was consulted and underwent MRI of the spine found to have severe C-spine disease at C4-C5 with spinal cord impingement  He returns today after course of prednisone and is also on opiate with relief of pain  The patient states he is receiving Medicaid assistance on arrival blood pressure still elevated 153/91 but he shows a blood pressure meter from home with numbers in the 125/80 range and he is compliant with his blood pressure medicine  Patient  states he is no longer using cocaine he is still smoking some cigarettes    Past Medical History:  Diagnosis Date  . Asthma   . Headache      Family History  Problem Relation Age of Onset  . Diabetes Mother   . Hypertension Mother   . Hypertension Father   . Cancer Father   . Colon cancer Father   . Lung cancer Father      Social History   Socioeconomic History  . Marital status: Legally Separated    Spouse name: Not on file  . Number of children: Not on file  . Years of education: Not on file  . Highest education level: Not on file  Occupational History  . Not on file  Tobacco Use  . Smoking status: Current Every Day Smoker    Packs/day: 0.25    Types: Cigarettes  . Smokeless tobacco: Never Used  Vaping Use  . Vaping Use: Former  Substance and Sexual Activity  . Alcohol use: Yes    Alcohol/week: 3.0 standard drinks    Types: 3 Cans of beer per week    Comment: 3 week  . Drug use: Yes    Types: Marijuana, Cocaine    Comment: rarely uses cocaine  . Sexual activity: Not Currently  Other Topics Concern  . Not on file  Social History Narrative   Right handed   Occasional prn caffeine   One story home  Social Determinants of Health   Financial Resource Strain: Not on file  Food Insecurity: Not on file  Transportation Needs: Not on file  Physical Activity: Not on file  Stress: Not on file  Social Connections: Not on file  Intimate Partner Violence: Not on file     No Known Allergies   Outpatient Medications Prior to Visit  Medication Sig Dispense Refill  . methocarbamol (ROBAXIN) 500 MG tablet Take 1 tablet (500 mg total) by mouth every 6 (six) hours as needed for muscle spasms. 90 tablet 1  . oxyCODONE-acetaminophen (PERCOCET) 10-325 MG tablet Take 1 tablet by mouth every 6 (six) hours as needed for up to 10 days for pain. 40 tablet 0  . predniSONE (DELTASONE) 10 MG tablet Take 4 for four days 3 for four days 2 for four days 1 for four days 40 tablet  0  . amLODipine (NORVASC) 10 MG tablet Take 1 tablet (10 mg total) by mouth daily. 90 tablet 3  . aspirin EC 81 MG tablet Take 81 mg by mouth daily. Swallow whole.    . losartan (COZAAR) 100 MG tablet Take 1 tablet (100 mg total) by mouth daily. 90 tablet 3  . metoprolol succinate (TOPROL-XL) 50 MG 24 hr tablet Take 1 tablet (50 mg total) by mouth daily. Take with or immediately following a meal. (Patient not taking: Reported on 08/20/2020) 90 tablet 3   No facility-administered medications prior to visit.      Review of Systems  Constitutional: Positive for fatigue.  HENT: Negative.   Respiratory: Negative.   Cardiovascular: Negative.   Gastrointestinal: Negative.   Genitourinary: Negative.   Musculoskeletal: Positive for gait problem.  Neurological: Positive for dizziness, tremors, weakness, light-headedness and numbness. Negative for seizures, facial asymmetry and headaches.  Psychiatric/Behavioral: Positive for agitation, behavioral problems, confusion and decreased concentration. Negative for self-injury, sleep disturbance and suicidal ideas. The patient is nervous/anxious.        Objective:   Physical Exam   Vitals:   08/20/20 1026 08/20/20 1459  BP: (!) 153/91 123/77  Pulse: 66   Resp: 16   Temp: 97.9 F (36.6 C)   TempSrc: Oral   SpO2: 96%   Weight: 162 lb (73.5 kg)   Height: 5\' 11"  (1.803 m)     Gen: Pleasant, well-nourished, in no distress,  normal affect  ENT: No lesions,  mouth clear,  oropharynx clear, no postnasal drip  Neck: No JVD, no TMG, no carotid bruits  Lungs: No use of accessory muscles, no dullness to percussion, clear without rales or rhonchi  Cardiovascular: RRR, heart sounds normal, no murmur or gallops, no peripheral edema  Abdomen: soft and NT, no HSM,  BS normal  Musculoskeletal: No deformities, no cyanosis or clubbing  Neuro: Patient is weak in the right upper extremity and right lower extremity with resting tremor observed The  extraocular muscles are intact.  The patient has difficulty with gait without a cane and falls to the right side.    Skin: Warm, no lesions or rashes  MRI C/T/L spine per dr :  IMPRESSION: 1. Prominent posterior disc herniation at C4-5 resulting in severe spinal canal stenosis with cord compression. 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 2. No significant degenerative changes of the thoracic or lumbar spine.     Assessment & Plan:  I personally reviewed all images and lab data in the [facility] system as well as any outside material available during this office  visit and agree with the  radiology impressions.   Primary hypertension HomePatient's been out of his blood pressure medicines for several days but pressure readings while on medication show he is well controlled so we will refill his amlodipine and continue valsartan HCT  History of cocaine use Not actively using  Cervical disc disease with myelopathy Recent MRI shows severe cervical disc disease with myelopathy  Plan referral to Dr. Annell Greening of [facility] orthopedics for consideration of surgery continue opiate therapy patient signed chronic pain management contract and will obtain a drug screen at this visit  [facility] drug database reviewed no other prescribers seen  Cervical radiculopathy at C5 As per cervical disease assessment  Chronic pain syndrome Plan to continue chronic opiate therapy  Tobacco use    Current smoking consumption amount: 5 cigarettes a day . Dicsussion on advise to quit smoking and smoking impacts: Cardiovascular impacts  . Patient's willingness to quit: Might be willing to cut back on smoking  . Methods to quit smoking discussed: Behavioral modification nicotine replacement  . Medication management of smoking session drugs discussed: No medications as of yet to we can work-up the patient's neuro status  . Resources  provided:  AVS   . Setting quit date not yet established  . Follow-up arranged 17month   Time spent counseling the patient: 1 month    Milas was seen today for follow-up.  Diagnoses and all orders for this visit:  Cervical disc disease with myelopathy -     226333 11+Oxyco+Alc+Crt-Bund  Chronic pain syndrome -     [id number] 11+Oxyco+Alc+Crt-Bund  Cervical radiculopathy at C5 -     [id number] 11+Oxyco+Alc+Crt-Bund  Need for hepatitis C screening test -     HCV Ab w Reflex to Quant PCR  Encounter for screening for HIV -     HIV Antibody (routine testing w rflx)  Primary hypertension  History of cocaine use  Tobacco use  Other orders -     amLODipine (NORVASC) 10 MG tablet; Take 1 tablet (10 mg total) by mouth daily. -     valsartan-hydrochlorothiazide (DIOVAN HCT) 80-12.5 MG tablet; Take 1 tablet by mouth daily.

## 2020-08-20 NOTE — Assessment & Plan Note (Signed)
As per cervical disease assessment

## 2020-08-20 NOTE — Progress Notes (Signed)
Concerns and f/u with Neuroloogist.  Shaking/ tremors Falls Request Handicap parking form filled out

## 2020-08-21 ENCOUNTER — Telehealth (INDEPENDENT_AMBULATORY_CARE_PROVIDER_SITE_OTHER): Payer: Self-pay

## 2020-08-21 LAB — HCV AB W REFLEX TO QUANT PCR: HCV Ab: <0.1 s/co ratio (ref 0.0–0.9)

## 2020-08-21 LAB — HIV ANTIBODY (ROUTINE TESTING W REFLEX): HIV Screen 4th Generation wRfx: NONREACTIVE

## 2020-08-21 LAB — HCV INTERPRETATION

## 2020-08-21 NOTE — Telephone Encounter (Signed)
Per DPR left message informing patient that hep c is negative. Maryjean Morn, CMA

## 2020-08-21 NOTE — Progress Notes (Signed)
Let pt know hep c is Negative

## 2020-08-21 NOTE — Progress Notes (Signed)
Due to the COVID-19 crisis, this telephone visit was done via telephone from my office and it was initiated and consent given by this patient and or family.  Telephone (Audio) Visit Due to inability to log onto a video virtual visit, the appointment was switched to a telephone encounter.  The purpose of this telephone visit is to provide medical care while limiting exposure to the novel coronavirus.    Consent was obtained for telephone visit and initiated by pt/family:  Yes Answered questions that patient had about telehealth interaction:  Yes I discussed the limitations, risks, security and privacy concerns of performing an evaluation and management service by telephone. I also discussed with the patient that there may be a patient responsible charge related to this service. The patient expressed understanding and agreed to proceed.  Pt location: Home Physician Location: office Name of referring provider:  Storm Frisk, MD I connected with .Jon Nolan at patients initiation/request on 08/24/2020 at  3:30 PM EST by telephone and verified that I am speaking with the correct person using two identifiers.  Pt MRN:  [id number] Pt DOB:  01-15-1986  Assessment/Plan:   Cervical canal stenosis with myelopathy  1.  Upcoming appointment with spine surgeon for surgery. 2.  Follow up with me as needed.    Need for in person visit now:  No.  Subjective:  Jon Nolan is a 35 year old right-handed male who follows up for compressive cervical myelopathy.  UPDATE: MRI of the spine with and without contrast was performed on 08/13/2020, which was personally reviewed.  Thoracic and lumbar spine unremarkable but cervical spine imaging showed posterior disc herniation at C4-5 resulting in severe spinal canal stenosis causing cord compression with edema and contrast enhancement.  He was referred urgently to a spine surgeon.  He has an upcoming appointment next week.  He reports ongoing numbness  in the hand.  HISTORY: Beginning around March 2021, he was pushing his car out of the mud when he slipped and fell on his left shoulder.  Once he was on the ground, he wasn't able to move his body for 10 to 15 minutes.  He went to Urgent Care for his left shoulder pain and was given a steroid injection.  Afterwards, he started having gradually progressive weakness in the legs, right worse than left.  Sometimes his legs will spontaneously start shaking. He endorses left sided upper and lower back pain with sharp pain radiating down the posterior left leg to the knee.  He then developed numbness and tingling in his hands, making it difficulty to hold or carry objects.  He has had progressively worsening gait and has had several falls.  He now requires a cane to help with walking.  He reports increased difficulty having a bowel movement.  Due to worsening symptoms, he went to the ED on 06/25/2020 where X-ray of lumbar spine was negative.  He went to Urgent Care on 06/29/2020 due to new low back and right leg pain.  He was diagnosed with muscle spasms and discharged with naproxen and Flexeril.  Due to suspected stroke, he was started on Plavix and had an outpatient MRI of brain with and without contrast on 07/07/2020 which was personally reviewed and showed mild scattered punctate T2/FLAIR hyperintense foci within the cerebral white matter but no acute stroke.  He was advised to discontinue Plavix but take ASA.  He does have history of marijuana, cocaine and cigarette use.  Labs in November include:  B12 272, TSH 0.456, Hgb A1c 5.9, LDL 91, positive cannabinoids, positive cocaine.    He has history of multiple concussions related to football as a teenager.  He also has history of migraines.  No family history of neurological disorders.   Objective:   Vitals:   08/24/20 1437  Weight: 162 lb (73.5 kg)  Height: 6' (1.829 m)     Follow Up Instructions:      -I discussed the assessment and  treatment plan with the patient. The patient was provided an opportunity to ask questions and all were answered. The patient agreed with the plan and demonstrated an understanding of the instructions.   The patient was advised to call back or seek an in-person evaluation if the symptoms worsen or if the condition fails to improve as anticipated.    Total Time spent in visit with the patient was:  6 minutes   Cira Servant, DO

## 2020-08-21 NOTE — Telephone Encounter (Signed)
-----   Message from Storm Frisk, MD sent at 08/21/2020  7:30 AM EST ----- Let pt know hep c is Negative

## 2020-08-24 ENCOUNTER — Telehealth (INDEPENDENT_AMBULATORY_CARE_PROVIDER_SITE_OTHER): Payer: Self-pay | Admitting: Neurology

## 2020-08-24 ENCOUNTER — Other Ambulatory Visit: Payer: Self-pay

## 2020-08-24 ENCOUNTER — Encounter: Payer: Self-pay | Admitting: Neurology

## 2020-08-24 VITALS — Ht 72.0 in | Wt 162.0 lb

## 2020-08-24 DIAGNOSIS — M5000 Cervical disc disorder with myelopathy, unspecified cervical region: Secondary | ICD-10-CM

## 2020-08-24 MED ORDER — OXYCODONE-ACETAMINOPHEN 10-325 MG PO TABS: 1.0000 | ORAL_TABLET | Freq: Four times a day (QID) | ORAL | 0 refills | Status: DC | PRN

## 2020-08-24 NOTE — Telephone Encounter (Signed)
Will forward to pcp

## 2020-08-25 ENCOUNTER — Ambulatory Visit (HOSPITAL_COMMUNITY)
Admission: RE | Admit: 2020-08-25 | Discharge: 2020-08-25 | Disposition: A | Discharge status: Home / Self Care | Payer: Self-pay | Source: Ambulatory Visit | Attending: Critical Care Medicine | Admitting: Critical Care Medicine

## 2020-08-25 ENCOUNTER — Encounter (HOSPITAL_COMMUNITY): Payer: Self-pay | Admitting: *Deleted

## 2020-08-25 ENCOUNTER — Emergency Department (HOSPITAL_COMMUNITY): Payer: HRSA Program

## 2020-08-25 ENCOUNTER — Other Ambulatory Visit: Payer: Self-pay

## 2020-08-25 ENCOUNTER — Emergency Department (HOSPITAL_COMMUNITY)
Admission: EM | Admit: 2020-08-25 | Discharge: 2020-08-25 | Disposition: A | Discharge status: Home / Self Care | Payer: HRSA Program | Attending: Emergency Medicine | Admitting: Emergency Medicine

## 2020-08-25 ENCOUNTER — Ambulatory Visit: Payer: Self-pay | Admitting: Orthopaedic Surgery

## 2020-08-25 DIAGNOSIS — Z79899 Other long term (current) drug therapy: Secondary | ICD-10-CM | POA: Insufficient documentation

## 2020-08-25 DIAGNOSIS — R000 Tachycardia, unspecified: Secondary | ICD-10-CM | POA: Diagnosis not present

## 2020-08-25 DIAGNOSIS — I639 Cerebral infarction, unspecified: Secondary | ICD-10-CM | POA: Insufficient documentation

## 2020-08-25 DIAGNOSIS — F17210 Nicotine dependence, cigarettes, uncomplicated: Secondary | ICD-10-CM | POA: Insufficient documentation

## 2020-08-25 DIAGNOSIS — I10 Essential (primary) hypertension: Secondary | ICD-10-CM | POA: Insufficient documentation

## 2020-08-25 DIAGNOSIS — I4891 Unspecified atrial fibrillation: Secondary | ICD-10-CM

## 2020-08-25 DIAGNOSIS — F1410 Cocaine abuse, uncomplicated: Secondary | ICD-10-CM | POA: Insufficient documentation

## 2020-08-25 DIAGNOSIS — U071 COVID-19: Secondary | ICD-10-CM | POA: Insufficient documentation

## 2020-08-25 DIAGNOSIS — I6389 Other cerebral infarction: Secondary | ICD-10-CM

## 2020-08-25 DIAGNOSIS — Z8673 Personal history of transient ischemic attack (TIA), and cerebral infarction without residual deficits: Secondary | ICD-10-CM | POA: Diagnosis not present

## 2020-08-25 DIAGNOSIS — J45909 Unspecified asthma, uncomplicated: Secondary | ICD-10-CM | POA: Insufficient documentation

## 2020-08-25 DIAGNOSIS — I081 Rheumatic disorders of both mitral and tricuspid valves: Secondary | ICD-10-CM | POA: Insufficient documentation

## 2020-08-25 HISTORY — DX: COVID-19: U07.1

## 2020-08-25 LAB — BASIC METABOLIC PANEL
Anion gap: 12 (ref 5–15)
BUN: 17 mg/dL (ref 6–20)
CO2: 23 mmol/L (ref 22–32)
Calcium: 9.6 mg/dL (ref 8.9–10.3)
Chloride: 102 mmol/L (ref 98–111)
Creatinine, Ser: 1.11 mg/dL (ref 0.61–1.24)
GFR, Estimated: >60 mL/min (ref >60)
Glucose, Bld: 96 mg/dL (ref 70–99)
Potassium: 4 mmol/L (ref 3.5–5.1)
Sodium: 137 mmol/L (ref 135–145)

## 2020-08-25 LAB — ECHOCARDIOGRAM COMPLETE
Area-P 1/2: 7.74 cm2
S' Lateral: 3.3 cm

## 2020-08-25 LAB — CBC
HCT: 46.5 % (ref 39.0–52.0)
Hemoglobin: 15.8 g/dL (ref 13.0–17.0)
MCH: 29.4 pg (ref 26.0–34.0)
MCHC: 34 g/dL (ref 30.0–36.0)
MCV: 86.6 fL (ref 80.0–100.0)
Platelets: 258 10*3/uL (ref 150–400)
RBC: 5.37 MIL/uL (ref 4.22–5.81)
RDW: 14.9 % (ref 11.5–15.5)
WBC: 15.3 10*3/uL — ABNORMAL HIGH (ref 4.0–10.5)
nRBC: 0 % (ref 0.0–0.2)

## 2020-08-25 LAB — MAGNESIUM: Magnesium: 2.3 mg/dL (ref 1.7–2.4)

## 2020-08-25 MED ORDER — DILTIAZEM HCL ER COATED BEADS 120 MG PO CP24
120.0000 mg | ORAL_CAPSULE | Freq: Once | ORAL | Status: AC
  Administered 2020-08-25: 120 mg via ORAL
  Filled 2020-08-25: qty 1

## 2020-08-25 MED ORDER — DILTIAZEM HCL ER COATED BEADS 120 MG PO CP24: 120.0000 mg | ORAL_CAPSULE | Freq: Every day | ORAL | 0 refills | Status: DC

## 2020-08-25 MED ORDER — DILTIAZEM HCL-DEXTROSE 125-5 MG/125ML-% IV SOLN (PREMIX)
5.0000 mg/h | INTRAVENOUS | Status: DC
  Administered 2020-08-25: 5 mg/h via INTRAVENOUS
  Filled 2020-08-25: qty 125

## 2020-08-25 NOTE — ED Provider Notes (Signed)
[facility] EMERGENCY DEPARTMENT Provider Note   CSN: [id number] Arrival date & time: 08/25/20  1549     History Chief Complaint  Patient presents with  . Atrial Fibrillation    Jon Nolan is a 35 y.o. male.  The history is provided by the patient and medical records.   Jon Nolan is a 35 y.o. male who presents to the Emergency Department complaining of atrial fibrillation. He presents to the emergency department upon referral from the echo lab for new onset atrial fibrillation. He was having a routine outpatient echo when he was noted to be tachycardic and was referred to the emergency department. Pre-ED EKG concerning for a fib with RVR. He states that he does not have any current symptoms. He has been experiencing right-sided arm and leg weakness for several months due to a herniated disc in his neck. He states that at times he has weakness in the left side as well. He denies any fevers, chest pain, shortness of breath. Symptoms are moderate and constant nature.    Past Medical History:  Diagnosis Date  . Asthma   . Headache     Patient Active Problem List   Diagnosis Date Noted  . Cervical radiculopathy at C5 08/20/2020  . Chronic pain syndrome 08/20/2020  . Cervical disc disease with myelopathy 08/20/2020  . History of cocaine use 07/06/2020  . Primary hypertension 07/06/2020  . Tobacco use 07/06/2020    History reviewed. No pertinent surgical history.     Family History  Problem Relation Age of Onset  . Diabetes Mother   . Hypertension Mother   . Hypertension Father   . Cancer Father   . Colon cancer Father   . Lung cancer Father     Social History   Tobacco Use  . Smoking status: Current Every Day Smoker    Packs/day: 0.25    Types: Cigarettes  . Smokeless tobacco: Never Used  Vaping Use  . Vaping Use: Former  Substance Use Topics  . Alcohol use: Yes    Alcohol/week: 3.0 standard drinks    Types: 3 Cans of beer per week     Comment: 3 week  . Drug use: Yes    Types: Marijuana, Cocaine    Comment: rarely uses cocaine    Home Medications Prior to Admission medications   Medication Sig Start Date End Date Taking? Authorizing Provider  diltiazem (CARDIZEM CD) 120 MG 24 hr capsule Take 1 capsule (120 mg total) by mouth daily. 08/25/20  Yes Tilden Fossa, MD  methocarbamol (ROBAXIN) 500 MG tablet Take 1 tablet (500 mg total) by mouth every 6 (six) hours as needed for muscle spasms. Patient not taking: Reported on 08/24/2020 07/16/20   Storm Frisk, MD  oxyCODONE-acetaminophen (PERCOCET) 10-325 MG tablet Take 1 tablet by mouth every 6 (six) hours as needed for up to 10 days for pain. 08/24/20 09/03/20  Storm Frisk, MD  predniSONE (DELTASONE) 10 MG tablet Take 4 for four days 3 for four days 2 for four days 1 for four days 08/14/20   Storm Frisk, MD  valsartan-hydrochlorothiazide (DIOVAN HCT) 80-12.5 MG tablet Take 1 tablet by mouth daily. 08/20/20   Storm Frisk, MD  amLODipine (NORVASC) 10 MG tablet Take 1 tablet (10 mg total) by mouth daily. 08/20/20 08/25/20  Storm Frisk, MD    Allergies    Patient has no known allergies.  Review of Systems   Review of Systems  All other  systems reviewed and are negative.   Physical Exam Updated Vital Signs BP 128/88   Pulse (!) 105   Temp 98.7 F (37.1 C) (Oral)   Resp (!) 21   Ht 6' (1.829 m)   Wt 73.5 kg   SpO2 100%   BMI 21.97 kg/m   Physical Exam Vitals and nursing note reviewed.  Constitutional:      Appearance: He is well-developed and well-nourished.  HENT:     Head: Normocephalic and atraumatic.  Cardiovascular:     Rate and Rhythm: Tachycardia present. Rhythm irregular.     Heart sounds: No murmur heard.   Pulmonary:     Effort: Pulmonary effort is normal. No respiratory distress.     Breath sounds: Normal breath sounds.  Abdominal:     Palpations: Abdomen is soft.     Tenderness: There is no abdominal tenderness.  There is no guarding or rebound.  Musculoskeletal:        General: No tenderness or edema.  Skin:    General: Skin is warm and dry.  Neurological:     Mental Status: He is alert and oriented to person, place, and time.  Psychiatric:        Mood and Affect: Mood and affect normal.        Behavior: Behavior normal.     ED Results / Procedures / Treatments   Labs (all labs ordered are listed, but only abnormal results are displayed) Labs Reviewed  CBC - Abnormal; Notable for the following components:      Result Value   WBC 15.3 (*)    All other components within normal limits  SARS CORONAVIRUS 2 (TAT 6-24 HRS)  BASIC METABOLIC PANEL  MAGNESIUM    EKG None  Radiology DG Chest 2 View  Result Date: 08/25/2020 CLINICAL DATA:  New onset atrial fibrillation EXAM: CHEST - 2 VIEW COMPARISON:  None. FINDINGS: The heart size and mediastinal contours are within normal limits. Both lungs are clear. The visualized skeletal structures are unremarkable. IMPRESSION: No active cardiopulmonary disease. Electronically Signed   By: Alcide Clever M.D.   On: 08/25/2020 17:45   ECHOCARDIOGRAM COMPLETE  Result Date: 08/25/2020    ECHOCARDIOGRAM REPORT   Patient Name:   Jon Nolan Date of Exam: 08/25/2020 Medical Rec #:  [id number]       Height:       72.0 in Accession #:    [id number]      Weight:       162.0 lb Date of Birth:  1985/08/20       BSA:          1.948 m Patient Age:    34 years        BP:           124/78 mmHg Patient Gender: M               HR:           147 bpm. Exam Location:  Outpatient Procedure: 2D Echo Indications:    stroke I163.9  History:        Patient has no prior history of Echocardiogram examinations.  Sonographer:    Thurman Coyer RDCS (AE) Referring Phys: [id number] PATRICK E WRIGHT IMPRESSIONS  1. Left ventricular ejection fraction, by estimation, is 50 to 55%. The left ventricle has low normal function. The left ventricle has no regional wall motion abnormalities. Left  ventricular diastolic function could not be evaluated.  2. Right ventricular systolic function  is normal. The right ventricular size is normal. There is normal pulmonary artery systolic pressure. The estimated right ventricular systolic pressure is 16.8 mmHg.  3. The mitral valve is normal in structure. Mild mitral valve regurgitation. No evidence of mitral stenosis.  4. The aortic valve has an indeterminant number of cusps. Aortic valve regurgitation is not visualized. Mild aortic valve sclerosis is present, with no evidence of aortic valve stenosis.  5. The inferior vena cava is normal in size with greater than 50% respiratory variability, suggesting right atrial pressure of 3 mmHg. FINDINGS  Left Ventricle: Left ventricular ejection fraction, by estimation, is 50 to 55%. The left ventricle has low normal function. The left ventricle has no regional wall motion abnormalities. The left ventricular internal cavity size was normal in size. There is no left ventricular hypertrophy. Left ventricular diastolic function could not be evaluated due to atrial fibrillation. Left ventricular diastolic function could not be evaluated. Right Ventricle: The right ventricular size is normal. No increase in right ventricular wall thickness. Right ventricular systolic function is normal. There is normal pulmonary artery systolic pressure. The tricuspid regurgitant velocity is 1.86 m/s, and  with an assumed right atrial pressure of 3 mmHg, the estimated right ventricular systolic pressure is 16.8 mmHg. Left Atrium: Left atrial size was normal in size. Right Atrium: Right atrial size was normal in size. Pericardium: There is no evidence of pericardial effusion. Mitral Valve: The mitral valve is normal in structure. Mild mitral valve regurgitation. No evidence of mitral valve stenosis. Tricuspid Valve: The tricuspid valve is normal in structure. Tricuspid valve regurgitation is mild . No evidence of tricuspid stenosis. Aortic Valve:  The aortic valve has an indeterminant number of cusps. Aortic valve regurgitation is not visualized. Mild aortic valve sclerosis is present, with no evidence of aortic valve stenosis. Pulmonic Valve: The pulmonic valve was normal in structure. Pulmonic valve regurgitation is not visualized. No evidence of pulmonic stenosis. Aorta: The aortic root is normal in size and structure. Venous: The inferior vena cava is normal in size with greater than 50% respiratory variability, suggesting right atrial pressure of 3 mmHg. IAS/Shunts: No atrial level shunt detected by color flow Doppler.  LEFT VENTRICLE PLAX 2D LVIDd:         4.30 cm  Diastology LVIDs:         3.30 cm  LV e' medial:   11.70 cm/s LV PW:         0.90 cm  LV E/e' medial: 5.1 LV IVS:        1.00 cm LVOT diam:     2.00 cm LV SV:         36 LV SV Index:   18 LVOT Area:     3.14 cm  RIGHT VENTRICLE RV S prime:     8.81 cm/s LEFT ATRIUM           Index       RIGHT ATRIUM           Index LA diam:      2.50 cm 1.28 cm/m  RA Area:     11.60 cm LA Vol (A2C): 52.8 ml 27.10 ml/m RA Volume:   26.30 ml  13.50 ml/m LA Vol (A4C): 35.5 ml 18.22 ml/m  AORTIC VALVE LVOT Vmax:   92.43 cm/s LVOT Vmean:  61.867 cm/s LVOT VTI:    0.114 m  AORTA Ao Root diam: 2.00 cm MITRAL VALVE  TRICUSPID VALVE MV Area (PHT): 7.74 cm    TR Peak grad:   13.8 mmHg MV Decel Time: 98 msec     TR Vmax:        186.00 cm/s MV E velocity: 60.20 cm/s MV A velocity: 78.80 cm/s  SHUNTS MV E/A ratio:  0.76        Systemic VTI:  0.11 m                            Systemic Diam: 2.00 cm Armanda Magic MD Electronically signed by Armanda Magic MD Signature Date/Time: 08/25/2020/5:43:49 PM    Final     Procedures Procedures (including critical care time)  Medications Ordered in ED Medications  diltiazem (CARDIZEM CD) 24 hr capsule 120 mg (has no administration in time range)    ED Course  I have reviewed the triage vital signs and the nursing notes.  Pertinent labs & imaging results  that were available during my care of the patient were reviewed by me and considered in my medical decision making (see chart for details).    MDM Rules/Calculators/A&P                         This patients CHA2DS2-VASc Score and unadjusted Ischemic Stroke Rate (% per year) is equal to 0.6 % stroke rate/year from a score of 1  Above score calculated as 1 point each if present [CHF, HTN, DM, Vascular=MI/PAD/Aortic Plaque, Age if 65-74, or Male] Above score calculated as 2 points each if present [Age > 75, or Stroke/TIA/TE]  Patient referred to the emergency department for asymptomatic atrial fibrillation, new onset. It is unclear when his a fib occurred. He does have a history of CVA document in the chart as a medical problem, but when discussing with the patient and in reviewing his imaging it does not appear that he has a true history of CVA. This would put his Chadsvasc score at one. Given his age and comorbidities will withhold anticoagulation at this time and refer to the a fib clinic for further follow-up. Will start on Cardizem and discontinue his amlodipine at this time. Discussed with patient home care for atrial fibrillation. Discussed outpatient follow-up and return precautions.   Final Clinical Impression(s) / ED Diagnoses Final diagnoses:  New onset atrial fibrillation [facility])    Rx / DC Orders ED Discharge Orders         Ordered    Amb Referral to AFIB Clinic        08/25/20 2147    diltiazem (CARDIZEM CD) 120 MG 24 hr capsule  Daily        08/25/20 2206           Tilden Fossa, MD 08/25/20 2326

## 2020-08-25 NOTE — Progress Notes (Signed)
  Echocardiogram 2D Echocardiogram has been performed.  Jon Nolan 08/25/2020, 4:01 PM

## 2020-08-25 NOTE — ED Triage Notes (Signed)
Pt reports going for outpatient echo and sent here due to being in afib. Pt has no complaints. No acute distress is noted. RR116.

## 2020-08-25 NOTE — Discharge Instructions (Addendum)
Stop taking your amlodipine.  Start taking the cardizem.  You received your first dose in the hospital today.  Get rechecked if you develop chest pain, difficulty breathing, new weakness or new concerning symptoms.

## 2020-08-26 LAB — SARS CORONAVIRUS 2 (TAT 6-24 HRS): SARS Coronavirus 2: POSITIVE — AB

## 2020-08-27 ENCOUNTER — Telehealth: Payer: Self-pay | Admitting: Physician Assistant

## 2020-08-27 NOTE — Telephone Encounter (Signed)
     I went in pt's chart to see who called him. 

## 2020-08-27 NOTE — Telephone Encounter (Signed)
Pt is wanting another cb from Dr Lynelle Doctor nurse [phone]  Re covid

## 2020-08-27 NOTE — Telephone Encounter (Signed)
Called to discuss with patient about COVID-19 symptoms and the use of one of the available treatments for those with mild to moderate Covid symptoms and at a high risk of hospitalization.  Pt appears to qualify for outpatient treatment due to co-morbid conditions and/or a member of an at-risk group in accordance with the FDA Emergency Use Authorization.    Symptom onset: unclear, tested in the ER when he presented for afib with RVR Vaccinated: unknonwn Booster? unknonwn Immunocompromised? no Qualifiers: high SVI, HTN, afib, substance abuse.   Unable to reach pt - left VM and sent mychart message.   Jon Nolan

## 2020-09-02 ENCOUNTER — Ambulatory Visit: Payer: Self-pay | Admitting: Orthopaedic Surgery

## 2020-09-02 ENCOUNTER — Encounter: Payer: Self-pay | Admitting: Orthopaedic Surgery

## 2020-09-02 ENCOUNTER — Ambulatory Visit (INDEPENDENT_AMBULATORY_CARE_PROVIDER_SITE_OTHER): Payer: Self-pay

## 2020-09-02 VITALS — BP 147/97 | HR 84

## 2020-09-02 DIAGNOSIS — M542 Cervicalgia: Secondary | ICD-10-CM

## 2020-09-02 DIAGNOSIS — M4802 Spinal stenosis, cervical region: Secondary | ICD-10-CM

## 2020-09-02 DIAGNOSIS — G992 Myelopathy in diseases classified elsewhere: Secondary | ICD-10-CM

## 2020-09-02 DIAGNOSIS — M5020 Other cervical disc displacement, unspecified cervical region: Secondary | ICD-10-CM

## 2020-09-03 ENCOUNTER — Encounter (HOSPITAL_COMMUNITY): Payer: Self-pay | Admitting: Orthopaedic Surgery

## 2020-09-03 ENCOUNTER — Telehealth (HOSPITAL_COMMUNITY): Payer: Self-pay | Admitting: *Deleted

## 2020-09-03 ENCOUNTER — Other Ambulatory Visit: Payer: Self-pay

## 2020-09-03 ENCOUNTER — Inpatient Hospital Stay (HOSPITAL_COMMUNITY)
Admission: RE | Admit: 2020-09-03 | Discharge: 2020-09-03 | Disposition: A | Discharge status: Home / Self Care | Payer: Self-pay | Source: Ambulatory Visit

## 2020-09-03 DIAGNOSIS — M5020 Other cervical disc displacement, unspecified cervical region: Secondary | ICD-10-CM | POA: Insufficient documentation

## 2020-09-03 DIAGNOSIS — G992 Myelopathy in diseases classified elsewhere: Secondary | ICD-10-CM | POA: Insufficient documentation

## 2020-09-03 HISTORY — DX: Other cervical disc displacement, unspecified cervical region: M50.20

## 2020-09-03 MED ORDER — OXYCODONE-ACETAMINOPHEN 10-325 MG PO TABS: 1.0000 | ORAL_TABLET | Freq: Four times a day (QID) | ORAL | 0 refills | Status: DC | PRN

## 2020-09-03 NOTE — Progress Notes (Signed)
Office Visit Note   Patient: Jon Nolan           Date of Birth: 1986/06/21           MRN: [id number] Visit Date: 09/02/2020              Requested by: Storm Frisk, MD [address] [place],  [place] [address] PCP: Storm Frisk, MD   Assessment & Plan: Visit Diagnoses:  1. Neck pain   2. Protrusion of cervical intervertebral disc   3. Stenosis of cervical spine with myelopathy (HCC)     Plan: Patient has cord compression with abnormal findings and myelopathic changes on physical exam.  He needs single level fusion at C4-5 with cord decompression.  Plan would be single level anterior cervical discectomy and fusion with allograft and plate.  Long discussion with the patient that with the cord damage she may not get any improvement or may get mild improvement.  If he continues to have following problems then he will have to have repeat discussion with Dr.Turner and it may be best to avoid anticoagulation due to risk of head injury acute head bleed.  He states she has been straightforward and he understands his risk of CVA is somewhere around  0.6 percent per year.  With patient's myelopathic problem weakness multiple falls we will proceed with decompression on urgent basis.  Plan would be overnight stay use of a soft collar postoperatively.  He understands he will still use the walker after the surgery.  Hopefully he will get some improvement but he understands that when the disc damages the cord he will be left with some permanent damage and improvement with surgery may be minimal, none or possibly more substantial.  Patient understands and requests we proceed.  Risk surgery discussed including pseudoarthrosis reoperation posterior fusion, dysphagia dysphonia.  Follow-Up Instructions: No follow-ups on file.   Orders:  Orders Placed This Encounter  Procedures  . XR Cervical Spine 2 or 3 views   No orders of the defined types were placed in this encounter.      Procedures: No procedures performed   Clinical Data: No additional findings.   Subjective: Chief Complaint  Patient presents with  . 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  He states he is trying to push her car out of the ditch and fell on his left shoulder.  He was not able to get up and ambulate and laid in the yard until he was assisted into the house.  Patient not been able to ambulate without assistance and has wide base myelopathic gait.  In certain standing positions he begins to lower extremity shaking and then falls.  He states he is fallen at least 20 times in the last year.  Also note his atrial fibrillation and he states Dr. Mayford Knife discussed possible anticoagulation after his cervical spine stenosis is surgically corrected.  I do not think she is aware of the number of times he is fallen.  Patient's had follow-up with Dr. Shan Levans.  He was referred for neurology consultation with Dr.Adam Everlena Cooper and MRI showed large prominent disc herniation at C4-5 with severe canal stenosis cord compression and cord edema and contrast-enhancement consistent with compressive myelopathy with cord myelomalacia.  Patient is here for scheduling surgery for his cervical stenosis.  Additional work-up showed head CT with some mild cerebral white matter changes slightly  more advanced expected for age no acute intracranial abnormalities.  Thoracic MRI scan showed some very small disc protrusions without compression.  Lumbar MRI scan showed no compression.  All studies were done on 08/13/2020.    Review of Systems positive history of cervical multiple concussions playing football as a teenager.  Stat history of migraines.  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  Positive history for cocaine marijuana cigarette use.  He still smokes some.  Positive for atrial  fibrillation.   Objective: Vital Signs: BP (!) 147/97   Pulse 84   Physical Exam Constitutional:      Appearance: He is well-developed and well-nourished.  HENT:     Head: Normocephalic and atraumatic.  Eyes:     Extraocular Movements: EOM normal.     Pupils: Pupils are equal, round, and reactive to light.  Neck:     Thyroid: No thyromegaly.     Trachea: No tracheal deviation.  Cardiovascular:     Rate and Rhythm: Normal rate.  Pulmonary:     Effort: Pulmonary effort is normal.     Breath sounds: No wheezing.  Abdominal:     General: Bowel sounds are normal.     Palpations: Abdomen is soft.  Skin:    General: Skin is warm and dry.     Capillary Refill: Capillary refill takes less than 2 seconds.  Neurological:     Mental Status: He is alert and oriented to person, place, and time.  Psychiatric:        Mood and Affect: Mood and affect normal.        Behavior: Behavior normal.        Thought Content: Thought content normal.        Judgment: Judgment normal.     Ortho Exam patient has hyperreflexia biceps triceps brachial radialis both right and left.  He is Press photographer with a walker.  Bilateral clonus.  Wide-based gait.  He has slight decrease anterior tib gastrocsoleus and quads.  Bilateral Hoffmann.  Toes downgoing.  Clonus nonsustained. Specialty Comments:  No specialty comments available.  Imaging: CLINICAL DATA:  Hyper reflexia, weakness.  EXAM: MRI CERVICAL, THORACIC AND LUMBAR SPINE WITHOUT AND WITH CONTRAST  TECHNIQUE: Multiplanar and multiecho pulse sequences of the cervical spine, to include the craniocervical junction and cervicothoracic junction, and thoracic and lumbar spine, were obtained without and with intravenous contrast.  CONTRAST:  10mL GADAVIST GADOBUTROL 1 MMOL/ML IV SOLN  COMPARISON:  None.  FINDINGS: MRI CERVICAL SPINE FINDINGS  Alignment: Straightening of the cervical curvature.  Vertebrae: No fracture, evidence of discitis, or  bone lesion.  Cord: Cord compression with cord edema in associated contrast enhancement at C4-5.  Posterior Fossa, vertebral arteries, paraspinal tissues: Mild mucosal thickening of the sphenoid sinus. Opacification of aerated petrous apices.  Disc levels:  C2-3: No spinal canal or neural foraminal stenosis.  C3-4: Small posterior disc protrusion without significant spinal canal stenosis. Uncovertebral and facet degenerative changes resulting moderate bilateral neural foraminal neck.  C4-5: Prominent posterior disc herniation resulting in severe spinal canal stenosis with cord compression. 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  C5-6: No spinal canal or neural foraminal stenosis.  C6-7: Small posterior disc protrusion. No significant spinal canal or neural foraminal stenosis.  C7-T1: No spinal canal or neural foraminal stenosis.  MRI THORACIC SPINE FINDINGS  Alignment:  Physiologic.  Vertebrae: No fracture,  evidence of discitis, or bone lesion.  Cord:  Normal signal and morphology.  Paraspinal and other soft tissues: Negative.  Disc levels:  Small posterior disc protrusions at T3-4, T4-5 and T6-7. No significant spinal canal or neural foraminal stenosis at any thoracic level.  MRI LUMBAR SPINE FINDINGS  Segmentation:  Standard.  Alignment:  Physiologic.  Vertebrae:  No fracture, evidence of discitis, or bone lesion.  Conus medullaris and cauda equina: Conus extends to the T12 level. Conus and cauda equina appear normal.  Paraspinal and other soft tissues: Negative.  Disc levels:  No significant disc bulge or herniation, spinal canal or neural foraminal stenosis at any level.  IMPRESSION: 1. Prominent posterior disc herniation at C4-5 resulting in severe spinal canal stenosis with cord compression. Cord edema and contrast enhancement at this level is likely secondary to  compressive myelopathy. 2. No significant degenerative changes of the thoracic or lumbar spine.  These results will be called to the ordering clinician or representative by the Radiologist Assistant, and communication documented in the PACS or [employer].   Electronically Signed   By: Baldemar Lenis M.D.   On: 08/13/2020 18:10   PMFS History: Patient Active Problem List   Diagnosis Date Noted  . Protrusion of cervical intervertebral disc 09/03/2020  . Stenosis of cervical spine with myelopathy (HCC) 09/03/2020  . Cervical radiculopathy at C5 08/20/2020  . Chronic pain syndrome 08/20/2020  . Cervical disc disease with myelopathy 08/20/2020  . History of cocaine use 07/06/2020  . Primary hypertension 07/06/2020  . Tobacco use 07/06/2020   Past Medical History:  Diagnosis Date  . Asthma   . Headache     Family History  Problem Relation Age of Onset  . Diabetes Mother   . Hypertension Mother   . Hypertension Father   . Cancer Father   . Colon cancer Father   . Lung cancer Father     History reviewed. No pertinent surgical history. Social History   Occupational History  . Not on file  Tobacco Use  . Smoking status: Current Every Day Smoker    Packs/day: 0.25    Types: Cigarettes  . Smokeless tobacco: Never Used  Vaping Use  . Vaping Use: Former  Substance and Sexual Activity  . Alcohol use: Yes    Alcohol/week: 3.0 standard drinks    Types: 3 Cans of beer per week    Comment: 3 week  . Drug use: Yes    Types: Marijuana, Cocaine    Comment: rarely uses cocaine  . Sexual activity: Not Currently

## 2020-09-03 NOTE — Telephone Encounter (Signed)
Per Dr. Ophelia Charter patient will need cardiac clearance prior to scheduled procedure Monday. Pt not established with cardiology - recent ER visit with atrial fibrillation. Echo ordered by PCP.  Pt will need to see cardiology for cardiac clearance and can follow up with Afib clinic after surgery if needed. Appt made with Dr. Flora Lipps 1/28 for cardiac clearance. Will forward cardiac clearance form to their office. The patient is aware of above appointment. Pt verbalized understanding of recommendations.

## 2020-09-03 NOTE — Progress Notes (Signed)
Patient tested positive for COVID 08/25/20.    No need to retest within 90 days of positive test result.    Ok to proceed with surgery as scheduled.

## 2020-09-03 NOTE — Progress Notes (Signed)
Spoke with pt for pre-op call. Pt recently diagnosed with HTN, Pre-Diabetes in November, 2021. Pt was seen in the ED on 08/25/20 for new onset A-fib. Pt also tested positive that day for Covid. He states he's not had any symptoms of Covid. Pt is to see Dr. Flora Lipps tomorrow for cardiac clearance. Pt states he cannot feel that his heart rate is irregular.   No Covid test is required since pt was positive on 08/25/20.  Chart sent to Anesthesia PA for review.

## 2020-09-04 ENCOUNTER — Other Ambulatory Visit: Payer: Self-pay

## 2020-09-04 ENCOUNTER — Encounter: Payer: Self-pay | Admitting: Cardiovascular Disease

## 2020-09-04 ENCOUNTER — Ambulatory Visit (INDEPENDENT_AMBULATORY_CARE_PROVIDER_SITE_OTHER): Payer: Self-pay | Admitting: Cardiovascular Disease

## 2020-09-04 VITALS — BP 101/62 | HR 108 | Ht 72.0 in | Wt 157.8 lb

## 2020-09-04 DIAGNOSIS — Z01810 Encounter for preprocedural cardiovascular examination: Secondary | ICD-10-CM

## 2020-09-04 DIAGNOSIS — I4819 Other persistent atrial fibrillation: Secondary | ICD-10-CM

## 2020-09-04 DIAGNOSIS — I10 Essential (primary) hypertension: Secondary | ICD-10-CM

## 2020-09-04 DIAGNOSIS — Z720 Tobacco use: Secondary | ICD-10-CM

## 2020-09-04 MED ORDER — LOSARTAN POTASSIUM 25 MG PO TABS: 25.0000 mg | ORAL_TABLET | Freq: Every day | ORAL | 3 refills | Status: DC

## 2020-09-04 MED ORDER — DILTIAZEM HCL ER COATED BEADS 240 MG PO CP24: 240.0000 mg | ORAL_CAPSULE | Freq: Every day | ORAL | 1 refills | Status: DC

## 2020-09-04 NOTE — Progress Notes (Signed)
Anesthesia Chart Review: Same-day work-up  Recent diagnosis of atrial fibrillation.  Echo 08/25/2020 showed EF 50 to 55%, no wall motion normalities, mild MR. He had preop evaluation with cardiologist Dr. Bufford Buttner on 09/04/2020.  Per note, "Preoperative cardiovascular examination -The Revised Cardiac Risk Index = 0, which equates to 0.4% (very low risk) estimated risk of perioperative myocardial infarction, pulmonary edema, ventricular fibrillation, cardiac arrest, or complete heart block.  -Recently diagnosed with atrial fibrillation however I think this is driven by his pain and everything that is going on. -Recent echocardiogram 50-55%. -His A. fib will be rate control to surgery. 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  His surgery is urgent in my opinion.  I would not delay his procedure by 4 to 6 weeks pursue a transesophageal echocardiogram.  He will see me back in 2 months after surgery is completed. -Rate control strategy is recommended through surgery.  No anticoagulation is recommended.  This will be addressed when I see him back after surgery. -Our service is available as needed in the peri-operative period."    Patient tested positive for Covid on 08/25/2020 when seen in the ED for new onset A. fib.  He was asymptomatic and has remained asymptomatic.  Per protocol will not be retested prior to surgery.  BMP and CBC from 08/25/2020 reviewed, WBC mildly elevated at 15.3, otherwise unremarkable.  EKG 08/25/2020: Atrial fibrillation.  Rate 105.  Anterior infarct, old.  Borderline ST elevation, inferior leads.  CHEST - 2 VIEW 08/25/2020: COMPARISON:  None.  FINDINGS: The heart size and mediastinal contours are within normal limits. Both lungs are clear. The visualized skeletal structures are unremarkable.  IMPRESSION: No active cardiopulmonary disease.  MR cervical spine 08/13/2020: IMPRESSION: 1. Prominent posterior disc herniation at C4-5 resulting in  severe spinal canal stenosis with cord compression. 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 2. No significant degenerative changes of the thoracic or lumbar spine.  TTE 08/25/2020: 1. Left ventricular ejection fraction, by estimation, is 50 to 55%. The  left ventricle has low normal function. The left ventricle has no regional  wall motion abnormalities. Left ventricular diastolic function could not  be evaluated.  2. Right ventricular systolic function is normal. The right ventricular  size is normal. There is normal pulmonary artery systolic pressure. The  estimated right ventricular systolic pressure is 16.8 mmHg.  3. The mitral valve is normal in structure. Mild mitral valve  regurgitation. No evidence of mitral stenosis.  4. The aortic valve has an indeterminant number of cusps. Aortic valve  regurgitation is not visualized. Mild aortic valve sclerosis is present,  with no evidence of aortic valve stenosis.  5. The inferior vena cava is normal in size with greater than 50%  respiratory variability, suggesting right atrial pressure of 3 mmHg.    Zannie Cove [facility]/Anesthesiology Phone [phone] 09/04/2020 3:59 PM

## 2020-09-04 NOTE — Patient Instructions (Signed)
Medication Instructions:  Increase diltiazem to 240 mg daily Stop Valsartan-HCTZ Start Losartan 25 mg daily   *If you need a refill on your cardiac medications before your next appointment, please call your pharmacy*  Follow-Up: At [facility], you and your health needs are our priority.  As part of our continuing mission to provide you with exceptional heart care, we have created designated Provider Care Teams.  These Care Teams include your primary Cardiologist (physician) and Advanced Practice Providers (APPs -  Physician Assistants and Nurse Practitioners) who all work together to provide you with the care you need, when you need it.  We recommend signing up for the patient portal called "MyChart".  Sign up information is provided on this After Visit Summary.  MyChart is used to connect with patients for Virtual Visits (Telemedicine).  Patients are able to view lab/test results, encounter notes, upcoming appointments, etc.  Non-urgent messages can be sent to your provider as well.   To learn more about what you can do with MyChart, go to [email or web address].    Your next appointment:   2 month(s)  The format for your next appointment:   In Person  Provider:   Lennie Odor, MD

## 2020-09-04 NOTE — Progress Notes (Signed)
Cardiology Office Note:   Date:  09/04/2020  NAME:  Jon Nolan    MRN: [id number] DOB:  February 05, 1986   PCP:  Storm Frisk, MD  Cardiologist:  No primary care provider on file.  Electrophysiologist:  None   Referring MD: Eldred Manges, MD   Chief Complaint  Patient presents with  . Atrial Fibrillation   History of Present Illness:   Jon Nolan is a 35 y.o. male with a hx of hypertension, tobacco abuse, C4-5 compression with cord impingement who is being seen today for the evaluation of atrial fibrillation at the request of Storm Frisk, MD.  He reports he suffered a fall in the summer 2021.  He had paralysis in his left arm and symptoms continue to worsen.  He has been found to have spinal cord impingement at the C4-5 level.  He will need urgent decompressive surgery.  He reports weakness in his arms and legs that appears surgery will operate on Monday.  He was sent for an MRI and echocardiogram with concern for possible stroke by his primary care physician.  At the time of his echocardiogram he was found to be in atrial fibrillation with RVR and sent to the emergency room.  He was prescribed diltiazem and sent to follow with [place].  His medical history is significant for hypertension, tobacco abuse and cocaine use.  He reports he is no longer using cocaine.  He denies any history of heart disease such as stroke or heart failure.  He has no lower extremity edema.  He reports he can walk slowly with a walker due to weakness in his legs.  He reports he has no chest pain or shortness of breath.  He can climb a flight of stairs but does this slowly.  He is not on anticoagulation and this is not needed. CHADSVASC=1.  He reports recovery time will be at least 6 weeks after surgery.  No prior history of atrial fibrillation.  He reports he does not feel his heart racing and is without symptoms from this.  Blood pressure 101/62 with heart rate in the low 100s.  He is still using cigarettes.   Smoking 4 to 5/day.  Advised to quit.  No strong family history of heart disease.  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  He has had a recent TSH obtained on 07/06/2020 which showed a TSH 1.24.  Interestingly on questioning he reports that his physician noticed an irregular heartbeat that was rapid in November.  No EKG was obtained.  It appears this is likely when he developed atrial fibrillation.  Problem List 1. HTN 2. Atrial fibrillation -dx 08/25/2020 3. 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 Tobacco abuse   Past Medical History: Past Medical History:  Diagnosis Date  . Asthma   . Atrial fibrillation (HCC)   . Hypertension   . Pre-diabetes     Past Surgical History: Past Surgical History:  Procedure Laterality Date  . NO PAST SURGERIES      Current Medications: Current Meds  Medication Sig  . BLACK ELDERBERRY PO Take 1 each by mouth daily. With Vit E and Zinc  . losartan (COZAAR) 25 MG tablet Take 1 tablet (25 mg total) by mouth daily.  Marland Kitchen oxyCODONE-acetaminophen (PERCOCET) 10-325 MG tablet Take 1 tablet by mouth every 6 (six) hours as needed for up to 10 days for pain.  . [DISCONTINUED] diltiazem (CARDIZEM CD) 120 MG 24 hr capsule Take 1 capsule (120 mg total)  by mouth daily.  . [DISCONTINUED] valsartan-hydrochlorothiazide (DIOVAN HCT) 80-12.5 MG tablet Take 1 tablet by mouth daily.     Allergies:    Patient has no known allergies.   Social History: Social History   Socioeconomic History  . Marital status: Single    Spouse name: Not on file  . Number of children: 3  . Years of education: Not on file  . Highest education level: Not on file  Occupational History  . Occupation: cook at [employer]  . Smoking status: Current Every Day Smoker    Packs/day: 0.25    Years: 13.00    Pack years: 3.25    Types: Cigarettes  . Smokeless tobacco: Never Used  Vaping Use  . Vaping Use: Former  Substance and Sexual Activity  . Alcohol use: Yes     Alcohol/week: 3.0 standard drinks    Types: 3 Cans of beer per week    Comment: 3 week  . Drug use: Yes    Types: Marijuana, Cocaine    Comment: no cocaine since 06/2020  . Sexual activity: Not Currently  Other Topics Concern  . Not on file  Social History Narrative   Right handed   Occasional prn caffeine   One story home   Social Determinants of Health   Financial Resource Strain: Not on file  Food Insecurity: Not on file  Transportation Needs: Not on file  Physical Activity: Not on file  Stress: Not on file  Social Connections: Not on file     Family History: The patient's family history includes Cancer in his father; Colon cancer in his father; Diabetes in his mother; Hypertension in his father and mother; Lung cancer in his father.  ROS:   All other ROS reviewed and negative. Pertinent positives noted in the HPI.     EKGs/Labs/Other Studies Reviewed:   The following studies were personally reviewed by me today:  EKG:  EKG is ordered today.  The ekg ordered today demonstrates atrial fibrillation heart rate 108, anteroseptal infarct noted, and was personally reviewed by me.   TTE 08/25/2020 1. Left ventricular ejection fraction, by estimation, is 50 to 55%. The  left ventricle has low normal function. The left ventricle has no regional  wall motion abnormalities. Left ventricular diastolic function could not  be evaluated.  2. Right ventricular systolic function is normal. The right ventricular  size is normal. There is normal pulmonary artery systolic pressure. The  estimated right ventricular systolic pressure is 16.8 mmHg.  3. The mitral valve is normal in structure. Mild mitral valve  regurgitation. No evidence of mitral stenosis.  4. The aortic valve has an indeterminant number of cusps. Aortic valve  regurgitation is not visualized. Mild aortic valve sclerosis is present,  with no evidence of aortic valve stenosis.  5. The inferior vena cava is normal in  size with greater than 50%  respiratory variability, suggesting right atrial pressure of 3 mmHg.   Recent Labs: 06/25/2020: ALT 16 07/06/2020: TSH 1.240 08/25/2020: BUN 17; Creatinine, Ser 1.11; Hemoglobin 15.8; Magnesium 2.3; Platelets 258; Potassium 4.0; Sodium 137   Recent Lipid Panel    Component Value Date/Time   CHOL 171 07/06/2020 1719   TRIG 93 07/06/2020 1719   HDL 63 07/06/2020 1719   CHOLHDL 2.7 07/06/2020 1719   LDLCALC 91 07/06/2020 1719    Physical Exam:   VS:  BP 101/62   Pulse (!) 108   Ht 6' (1.829 m)   Wt 157 lb  12.8 oz (71.6 kg)   SpO2 100%   BMI 21.40 kg/m    Wt Readings from Last 3 Encounters:  09/04/20 157 lb 12.8 oz (71.6 kg)  08/25/20 162 lb (73.5 kg)  08/24/20 162 lb (73.5 kg)    General: Well nourished, well developed, in no acute distress Head: Atraumatic, normal size  Eyes: PEERLA, EOMI  Neck: Supple, no JVD Endocrine: No thryomegaly Cardiac: Normal S1, S2; irregular rhythm, no murmurs rubs or gallops Lungs: Clear to auscultation bilaterally, no wheezing, rhonchi or rales  Abd: Soft, nontender, no hepatomegaly  Ext: No edema, pulses 2+ Skin: Warm and dry, no rashes   Neuro: Alert and oriented to person, place, time, and situation, CNII-XII grossly intact Psych: Normal mood and affect   ASSESSMENT:   Jon Nolan is a 35 y.o. male who presents for the following: 1. Persistent atrial fibrillation (HCC)   2. Preoperative cardiovascular examination   3. Primary hypertension   4. Tobacco abuse     PLAN:   1. Persistent atrial fibrillation (HCC) -He is in atrial fibrillation with RVR today.  Heart rate 108.  He has cervical compression with myelopathy.  He has a rather urgent need for surgery.  I recommended he proceed with surgery and then we will deal with his atrial fibrillation once this is taking care off. -CHADSVASC=1 so anticoagulation is not indicated at this time.  He will not need long-term anticoagulation either. -Recent  thyroid studies normal. -Recent echocardiogram shows normal LV function with normal chamber dimensions.  EF 50-55% no regional wall motion normalities. -Increase diltiazem to 240 mg daily for rate control. -He will need to be on short-term anticoagulation when we do pursue cardioversion however we will let him go and have surgery and heal up from this before we even consider this.  I did stress to him that the utmost importance is to have surgery taken care of given the urgent finding seen on his recent MRI. -I suspect pain could be a trigger for his atrial fibrillation.  He also has a history of using cocaine.  This could certainly drives atrial fibrillation.  He also has hypertension.  He is awfully young.  He does use marijuana in the past.  This could trigger his atrial fibrillation.  Unclear but it is safe to say that his heart structurally normal.  2. Preoperative cardiovascular examination -The Revised Cardiac Risk Index = 0, which equates to 0.4% (very low risk) estimated risk of perioperative myocardial infarction, pulmonary edema, ventricular fibrillation, cardiac arrest, or complete heart block.  -Recently diagnosed with atrial fibrillation however I think this is driven by his pain and everything that is going on. -Recent echocardiogram 50-55%. -His A. fib will be rate control to surgery. 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  His surgery is urgent in my opinion.  I would not delay his procedure by 4 to 6 weeks pursue a transesophageal echocardiogram.  He will see me back in 2 months after surgery is completed. -Rate control strategy is recommended through surgery.  No anticoagulation is recommended.  This will be addressed when I see him back after surgery. -Our service is available as needed in the peri-operative period.    3. Primary hypertension -Diltiazem increased to 240 mg daily.  Stop Diovan.  Start him on losartan 25 mg a day.  4. Tobacco  abuse -Smoking cessation counseling provided in office.    Disposition: Return in about 2 months (around 11/02/2020).  Medication Adjustments/Labs  and Tests Ordered: Current medicines are reviewed at length with the patient today.  Concerns regarding medicines are outlined above.  No orders of the defined types were placed in this encounter.  Meds ordered this encounter  Medications  . diltiazem (CARDIZEM CD) 240 MG 24 hr capsule    Sig: Take 1 capsule (240 mg total) by mouth daily.    Dispense:  90 capsule    Refill:  1  . losartan (COZAAR) 25 MG tablet    Sig: Take 1 tablet (25 mg total) by mouth daily.    Dispense:  90 tablet    Refill:  3    Patient Instructions  Medication Instructions:  Increase diltiazem to 240 mg daily Stop Valsartan-HCTZ Start Losartan 25 mg daily   *If you need a refill on your cardiac medications before your next appointment, please call your pharmacy*  Follow-Up: At [facility], you and your health needs are our priority.  As part of our continuing mission to provide you with exceptional heart care, we have created designated Provider Care Teams.  These Care Teams include your primary Cardiologist (physician) and Advanced Practice Providers (APPs -  Physician Assistants and Nurse Practitioners) who all work together to provide you with the care you need, when you need it.  We recommend signing up for the patient portal called "MyChart".  Sign up information is provided on this After Visit Summary.  MyChart is used to connect with patients for Virtual Visits (Telemedicine).  Patients are able to view lab/test results, encounter notes, upcoming appointments, etc.  Non-urgent messages can be sent to your provider as well.   To learn more about what you can do with MyChart, go to [email or web address].    Your next appointment:   2 month(s)  The format for your next appointment:   In Person  Provider:   Lennie Odor, MD       Signed, Lenna Gilford. Flora Lipps, MD [facility]  [address], [address] [place], [place] [address] [phone]  09/04/2020 3:24 PM

## 2020-09-04 NOTE — Anesthesia Preprocedure Evaluation (Addendum)
Anesthesia Evaluation  Patient identified by MRN, date of birth, ID band Patient awake    Reviewed: Allergy & Precautions, NPO status , Patient's Chart, lab work & pertinent test results  Airway Mallampati: I  TM Distance: >3 FB Neck ROM: Full    Dental no notable dental hx.    Pulmonary asthma , Current Smoker and Patient abstained from smoking.,    Pulmonary exam normal breath sounds clear to auscultation       Cardiovascular hypertension, Pt. on medications + dysrhythmias Atrial Fibrillation  Rhythm:Irregular Rate:Normal  ECG: A-fib with RVR   Neuro/Psych  Neuromuscular disease negative psych ROS   GI/Hepatic negative GI ROS, (+)     substance abuse  ,   Endo/Other  negative endocrine ROS  Renal/GU negative Renal ROS     Musculoskeletal Chronic pain syndrome   Abdominal   Peds  Hematology negative hematology ROS (+)   Anesthesia Other Findings C4-5 herniated nucleus pulposus stenosis with myelopathy  Reproductive/Obstetrics                           Anesthesia Physical Anesthesia Plan  ASA: III  Anesthesia Plan: General   Post-op Pain Management:    Induction: Intravenous  PONV Risk Score and Plan: 1 and Ondansetron, Dexamethasone, Midazolam and Treatment may vary due to age or medical condition  Airway Management Planned: Oral ETT and Video Laryngoscope Planned  Additional Equipment:   Intra-op Plan:   Post-operative Plan: Extubation in OR  Informed Consent: I have reviewed the patients History and Physical, chart, labs and discussed the procedure including the risks, benefits and alternatives for the proposed anesthesia with the patient or authorized representative who has indicated his/her understanding and acceptance.     Dental advisory given  Plan Discussed with: CRNA  Anesthesia Plan Comments: (PAT note by Antionette Poles, PA-C: Recent diagnosis of atrial  fibrillation.  Echo 08/25/2020 showed EF 50 to 55%, no wall motion normalities, mild MR. He had preop evaluation with cardiologist Dr. Bufford Buttner on 09/04/2020.  Per note, "Preoperative cardiovascular examination -The Revised Cardiac Risk Index = 0, which equates to 0.4% (very low risk) estimated risk of perioperative myocardial infarction, pulmonary edema, ventricular fibrillation, cardiac arrest, or complete heart block.  -Recently diagnosed with atrial fibrillation however I think this is driven by his pain and everything that is going on. -Recent echocardiogram 50-55%. -His A. fib will be rate control to surgery. 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  His surgery is urgent in my opinion.  I would not delay his procedure by 4 to 6 weeks pursue a transesophageal echocardiogram.  He will see me back in 2 months after surgery is completed. -Rate control strategy is recommended through surgery.  No anticoagulation is recommended.  This will be addressed when I see him back after surgery. -Our service is available as needed in the peri-operative period."    Patient tested positive for Covid on 08/25/2020 when seen in the ED for new onset A. fib.  He was asymptomatic and has remained asymptomatic.  Per protocol will not be retested prior to surgery.  BMP and CBC from 08/25/2020 reviewed, WBC mildly elevated at 15.3, otherwise unremarkable.  EKG 08/25/2020: Atrial fibrillation.  Rate 105.  Anterior infarct, old.  Borderline ST elevation, inferior leads.  CHEST - 2 VIEW 08/25/2020: COMPARISON:  None.  FINDINGS: The heart size and mediastinal contours are within normal limits. Both lungs are clear. The visualized  skeletal structures are unremarkable.  IMPRESSION: No active cardiopulmonary disease.  MR cervical spine 08/13/2020: IMPRESSION: 1. Prominent posterior disc herniation at C4-5 resulting in severe spinal canal stenosis with cord compression. Cord edema and  contrast enhancement at this level is likely secondary to compressive myelopathy. 2. No significant degenerative changes of the thoracic or lumbar spine.  TTE 08/25/2020: 1. Left ventricular ejection fraction, by estimation, is 50 to 55%. The  left ventricle has low normal function. The left ventricle has no regional  wall motion abnormalities. Left ventricular diastolic function could not  be evaluated.  2. Right ventricular systolic function is normal. The right ventricular  size is normal. There is normal pulmonary artery systolic pressure. The  estimated right ventricular systolic pressure is 16.8 mmHg.  3. The mitral valve is normal in structure. Mild mitral valve  regurgitation. No evidence of mitral stenosis.  4. The aortic valve has an indeterminant number of cusps. Aortic valve  regurgitation is not visualized. Mild aortic valve sclerosis is present,  with no evidence of aortic valve stenosis.  5. The inferior vena cava is normal in size with greater than 50%  respiratory variability, suggesting right atrial pressure of 3 mmHg.  )      Anesthesia Quick Evaluation

## 2020-09-07 ENCOUNTER — Other Ambulatory Visit: Payer: Self-pay

## 2020-09-07 ENCOUNTER — Ambulatory Visit (HOSPITAL_COMMUNITY): Payer: Self-pay | Admitting: Nurse Practitioner

## 2020-09-07 ENCOUNTER — Observation Stay (HOSPITAL_COMMUNITY)
Admission: RE | Admit: 2020-09-07 | Discharge: 2020-09-08 | Disposition: A | Discharge status: Home / Self Care | Payer: Self-pay | Attending: Orthopaedic Surgery | Admitting: Orthopaedic Surgery

## 2020-09-07 ENCOUNTER — Telehealth: Payer: Self-pay | Admitting: Critical Care Medicine

## 2020-09-07 ENCOUNTER — Ambulatory Visit (HOSPITAL_COMMUNITY): Payer: Self-pay | Admitting: Physician Assistant

## 2020-09-07 ENCOUNTER — Ambulatory Visit (HOSPITAL_COMMUNITY): Payer: Self-pay

## 2020-09-07 ENCOUNTER — Encounter (HOSPITAL_COMMUNITY): Payer: Self-pay | Admitting: Orthopaedic Surgery

## 2020-09-07 ENCOUNTER — Encounter (HOSPITAL_COMMUNITY)
Admission: RE | Disposition: A | Discharge status: Home / Self Care | Payer: Self-pay | Source: Home / Self Care | Attending: Orthopaedic Surgery

## 2020-09-07 DIAGNOSIS — M4802 Spinal stenosis, cervical region: Principal | ICD-10-CM | POA: Diagnosis present

## 2020-09-07 DIAGNOSIS — Z720 Tobacco use: Secondary | ICD-10-CM | POA: Insufficient documentation

## 2020-09-07 DIAGNOSIS — Z79899 Other long term (current) drug therapy: Secondary | ICD-10-CM | POA: Insufficient documentation

## 2020-09-07 DIAGNOSIS — I4891 Unspecified atrial fibrillation: Secondary | ICD-10-CM | POA: Insufficient documentation

## 2020-09-07 DIAGNOSIS — M5412 Radiculopathy, cervical region: Secondary | ICD-10-CM | POA: Insufficient documentation

## 2020-09-07 DIAGNOSIS — M50021 Cervical disc disorder at C4-C5 level with myelopathy: Secondary | ICD-10-CM | POA: Insufficient documentation

## 2020-09-07 DIAGNOSIS — Z419 Encounter for procedure for purposes other than remedying health state, unspecified: Secondary | ICD-10-CM

## 2020-09-07 DIAGNOSIS — I10 Essential (primary) hypertension: Secondary | ICD-10-CM | POA: Insufficient documentation

## 2020-09-07 HISTORY — PX: ANTERIOR CERVICAL DECOMP/DISCECTOMY FUSION: SHX1161

## 2020-09-07 HISTORY — DX: Essential (primary) hypertension: I10

## 2020-09-07 HISTORY — DX: Prediabetes: R73.03

## 2020-09-07 HISTORY — DX: Spinal stenosis, cervical region: M48.02

## 2020-09-07 HISTORY — DX: Unspecified atrial fibrillation: I48.91

## 2020-09-07 LAB — CBC
HCT: 43.4 % (ref 39.0–52.0)
Hemoglobin: 14.1 g/dL (ref 13.0–17.0)
MCH: 28.7 pg (ref 26.0–34.0)
MCHC: 32.5 g/dL (ref 30.0–36.0)
MCV: 88.4 fL (ref 80.0–100.0)
Platelets: 235 10*3/uL (ref 150–400)
RBC: 4.91 MIL/uL (ref 4.22–5.81)
RDW: 14.7 % (ref 11.5–15.5)
WBC: 8.6 10*3/uL (ref 4.0–10.5)
nRBC: 0 % (ref 0.0–0.2)

## 2020-09-07 LAB — COMPREHENSIVE METABOLIC PANEL
ALT: 16 U/L (ref 0–44)
AST: 21 U/L (ref 15–41)
Albumin: 3.6 g/dL (ref 3.5–5.0)
Alkaline Phosphatase: 41 U/L (ref 38–126)
Anion gap: 9 (ref 5–15)
BUN: 10 mg/dL (ref 6–20)
CO2: 21 mmol/L — ABNORMAL LOW (ref 22–32)
Calcium: 8.8 mg/dL — ABNORMAL LOW (ref 8.9–10.3)
Chloride: 104 mmol/L (ref 98–111)
Creatinine, Ser: 1.18 mg/dL (ref 0.61–1.24)
GFR, Estimated: >60 mL/min (ref >60)
Glucose, Bld: 89 mg/dL (ref 70–99)
Potassium: 3.6 mmol/L (ref 3.5–5.1)
Sodium: 134 mmol/L — ABNORMAL LOW (ref 135–145)
Total Bilirubin: 1.5 mg/dL — ABNORMAL HIGH (ref 0.3–1.2)
Total Protein: 6.2 g/dL — ABNORMAL LOW (ref 6.5–8.1)

## 2020-09-07 LAB — URINALYSIS, ROUTINE W REFLEX MICROSCOPIC
Bilirubin Urine: NEGATIVE
Glucose, UA: NEGATIVE mg/dL
Hgb urine dipstick: NEGATIVE
Ketones, ur: NEGATIVE mg/dL
Leukocytes,Ua: NEGATIVE
Nitrite: NEGATIVE
Protein, ur: NEGATIVE mg/dL
Specific Gravity, Urine: 1.008 (ref 1.005–1.030)
pH: 6 (ref 5.0–8.0)

## 2020-09-07 LAB — SURGICAL PCR SCREEN
MRSA, PCR: NEGATIVE
Staphylococcus aureus: NEGATIVE

## 2020-09-07 LAB — GLUCOSE, CAPILLARY: Glucose-Capillary: 103 mg/dL — ABNORMAL HIGH (ref 70–99)

## 2020-09-07 SURGERY — ANTERIOR CERVICAL DECOMPRESSION/DISCECTOMY FUSION 1 LEVEL
Anesthesia: General | Site: Spine Cervical

## 2020-09-07 MED ORDER — HYDROMORPHONE HCL 1 MG/ML IJ SOLN
0.5000 mg | INTRAMUSCULAR | Status: DC | PRN
  Administered 2020-09-07: 0.5 mg via INTRAVENOUS
  Filled 2020-09-07: qty 0.5

## 2020-09-07 MED ORDER — OXYCODONE HCL 5 MG PO TABS: 5.0000 mg | ORAL_TABLET | ORAL | Status: DC | PRN

## 2020-09-07 MED ORDER — ESMOLOL HCL 100 MG/10ML IV SOLN
INTRAVENOUS | Status: AC
  Filled 2020-09-07: qty 10

## 2020-09-07 MED ORDER — CHLORHEXIDINE GLUCONATE 0.12 % MT SOLN
OROMUCOSAL | Status: AC
  Administered 2020-09-07: 15 mL
  Filled 2020-09-07: qty 15

## 2020-09-07 MED ORDER — HEMOSTATIC AGENTS (NO CHARGE) OPTIME
TOPICAL | Status: DC | PRN
  Administered 2020-09-07: 1 via TOPICAL

## 2020-09-07 MED ORDER — LOSARTAN POTASSIUM 25 MG PO TABS
25.0000 mg | ORAL_TABLET | Freq: Every day | ORAL | Status: DC
  Administered 2020-09-07 – 2020-09-08 (×2): 25 mg via ORAL
  Filled 2020-09-07 (×2): qty 1

## 2020-09-07 MED ORDER — BUPIVACAINE-EPINEPHRINE (PF) 0.25% -1:200000 IJ SOLN
INTRAMUSCULAR | Status: AC
  Filled 2020-09-07: qty 30

## 2020-09-07 MED ORDER — ESMOLOL HCL 100 MG/10ML IV SOLN
INTRAVENOUS | Status: DC | PRN
  Administered 2020-09-07: 30 mg via INTRAVENOUS
  Administered 2020-09-07 (×2): 20 mg via INTRAVENOUS

## 2020-09-07 MED ORDER — ONDANSETRON HCL 4 MG/2ML IJ SOLN
INTRAMUSCULAR | Status: DC | PRN
  Administered 2020-09-07: 4 mg via INTRAVENOUS

## 2020-09-07 MED ORDER — PHENOL 1.4 % MT LIQD: 1.0000 | OROMUCOSAL | Status: DC | PRN

## 2020-09-07 MED ORDER — PROPOFOL 10 MG/ML IV BOLUS
INTRAVENOUS | Status: AC
  Filled 2020-09-07: qty 20

## 2020-09-07 MED ORDER — DILTIAZEM HCL ER COATED BEADS 240 MG PO CP24
240.0000 mg | ORAL_CAPSULE | Freq: Every day | ORAL | Status: DC
  Administered 2020-09-07 – 2020-09-08 (×2): 240 mg via ORAL
  Filled 2020-09-07: qty 1
  Filled 2020-09-07: qty 2
  Filled 2020-09-07: qty 1

## 2020-09-07 MED ORDER — MIDAZOLAM HCL 2 MG/2ML IJ SOLN
INTRAMUSCULAR | Status: AC
  Filled 2020-09-07: qty 2

## 2020-09-07 MED ORDER — PHENYLEPHRINE 40 MCG/ML (10ML) SYRINGE FOR IV PUSH (FOR BLOOD PRESSURE SUPPORT)
PREFILLED_SYRINGE | INTRAVENOUS | Status: DC | PRN
  Administered 2020-09-07: 120 ug via INTRAVENOUS

## 2020-09-07 MED ORDER — OXYCODONE HCL 5 MG PO TABS
5.0000 mg | ORAL_TABLET | Freq: Once | ORAL | Status: AC | PRN
  Administered 2020-09-07: 5 mg via ORAL

## 2020-09-07 MED ORDER — 0.9 % SODIUM CHLORIDE (POUR BTL) OPTIME
TOPICAL | Status: DC | PRN
  Administered 2020-09-07: 1000 mL

## 2020-09-07 MED ORDER — FENTANYL CITRATE (PF) 250 MCG/5ML IJ SOLN
INTRAMUSCULAR | Status: AC
  Filled 2020-09-07: qty 5

## 2020-09-07 MED ORDER — SODIUM CHLORIDE 0.9% FLUSH: 3.0000 mL | INTRAVENOUS | Status: DC | PRN

## 2020-09-07 MED ORDER — BUPIVACAINE-EPINEPHRINE 0.25% -1:200000 IJ SOLN
INTRAMUSCULAR | Status: DC | PRN
  Administered 2020-09-07: 6 mL

## 2020-09-07 MED ORDER — OXYCODONE-ACETAMINOPHEN 5-325 MG PO TABS: 1.0000 | ORAL_TABLET | Freq: Four times a day (QID) | ORAL | 0 refills | Status: DC | PRN

## 2020-09-07 MED ORDER — PROPOFOL 10 MG/ML IV BOLUS
INTRAVENOUS | Status: DC | PRN
  Administered 2020-09-07: 200 mg via INTRAVENOUS

## 2020-09-07 MED ORDER — DOCUSATE SODIUM 100 MG PO CAPS
100.0000 mg | ORAL_CAPSULE | Freq: Two times a day (BID) | ORAL | Status: DC
  Administered 2020-09-07 – 2020-09-08 (×2): 100 mg via ORAL
  Filled 2020-09-07 (×2): qty 1

## 2020-09-07 MED ORDER — MIDAZOLAM HCL 5 MG/5ML IJ SOLN
INTRAMUSCULAR | Status: DC | PRN
  Administered 2020-09-07: 2 mg via INTRAVENOUS

## 2020-09-07 MED ORDER — ONDANSETRON HCL 4 MG PO TABS: 4.0000 mg | ORAL_TABLET | Freq: Four times a day (QID) | ORAL | Status: DC | PRN

## 2020-09-07 MED ORDER — METHOCARBAMOL 500 MG PO TABS
500.0000 mg | ORAL_TABLET | Freq: Four times a day (QID) | ORAL | Status: DC | PRN
  Administered 2020-09-07 – 2020-09-08 (×2): 500 mg via ORAL
  Filled 2020-09-07 (×2): qty 1

## 2020-09-07 MED ORDER — SODIUM CHLORIDE 0.9% FLUSH: 3.0000 mL | Freq: Two times a day (BID) | INTRAVENOUS | Status: DC

## 2020-09-07 MED ORDER — LIDOCAINE 2% (20 MG/ML) 5 ML SYRINGE
INTRAMUSCULAR | Status: DC | PRN
  Administered 2020-09-07: 60 mg via INTRAVENOUS

## 2020-09-07 MED ORDER — OXYCODONE HCL 5 MG/5ML PO SOLN: 5.0000 mg | Freq: Once | ORAL | Status: AC | PRN

## 2020-09-07 MED ORDER — MENTHOL 3 MG MT LOZG: 1.0000 | LOZENGE | OROMUCOSAL | Status: DC | PRN

## 2020-09-07 MED ORDER — ACETAMINOPHEN 325 MG PO TABS
650.0000 mg | ORAL_TABLET | ORAL | Status: DC | PRN
  Administered 2020-09-07 – 2020-09-08 (×2): 650 mg via ORAL
  Filled 2020-09-07 (×2): qty 2

## 2020-09-07 MED ORDER — ONDANSETRON HCL 4 MG/2ML IJ SOLN: 4.0000 mg | Freq: Four times a day (QID) | INTRAMUSCULAR | Status: DC | PRN

## 2020-09-07 MED ORDER — HYDROMORPHONE HCL 1 MG/ML IJ SOLN
INTRAMUSCULAR | Status: AC
  Administered 2020-09-07: 0.5 mg via INTRAVENOUS
  Filled 2020-09-07: qty 1

## 2020-09-07 MED ORDER — POLYETHYLENE GLYCOL 3350 17 G PO PACK: 17.0000 g | PACK | Freq: Every day | ORAL | Status: DC

## 2020-09-07 MED ORDER — ACETAMINOPHEN 10 MG/ML IV SOLN
1000.0000 mg | Freq: Once | INTRAVENOUS | Status: DC | PRN
Start: 2020-09-07 — End: 2020-09-07

## 2020-09-07 MED ORDER — OXYCODONE HCL 5 MG PO TABS
ORAL_TABLET | ORAL | Status: AC
  Filled 2020-09-07: qty 1

## 2020-09-07 MED ORDER — HYDROMORPHONE HCL 1 MG/ML IJ SOLN
0.2500 mg | INTRAMUSCULAR | Status: DC | PRN
  Administered 2020-09-07: 0.5 mg via INTRAVENOUS

## 2020-09-07 MED ORDER — CEFAZOLIN SODIUM-DEXTROSE 2-4 GM/100ML-% IV SOLN
2.0000 g | INTRAVENOUS | Status: AC
  Administered 2020-09-07: 2 g via INTRAVENOUS
  Filled 2020-09-07: qty 100

## 2020-09-07 MED ORDER — DEXAMETHASONE SODIUM PHOSPHATE 10 MG/ML IJ SOLN
INTRAMUSCULAR | Status: DC | PRN
  Administered 2020-09-07: 10 mg via INTRAVENOUS

## 2020-09-07 MED ORDER — PROMETHAZINE HCL 25 MG/ML IJ SOLN: 6.2500 mg | INTRAMUSCULAR | Status: DC | PRN

## 2020-09-07 MED ORDER — CEFAZOLIN SODIUM-DEXTROSE 1-4 GM/50ML-% IV SOLN
1.0000 g | Freq: Three times a day (TID) | INTRAVENOUS | Status: AC
  Administered 2020-09-07 – 2020-09-08 (×2): 1 g via INTRAVENOUS
  Filled 2020-09-07 (×2): qty 50

## 2020-09-07 MED ORDER — ROCURONIUM BROMIDE 10 MG/ML (PF) SYRINGE
PREFILLED_SYRINGE | INTRAVENOUS | Status: DC | PRN
  Administered 2020-09-07: 60 mg via INTRAVENOUS

## 2020-09-07 MED ORDER — SODIUM CHLORIDE 0.9 % IV SOLN: INTRAVENOUS | Status: DC

## 2020-09-07 MED ORDER — FENTANYL CITRATE (PF) 250 MCG/5ML IJ SOLN
INTRAMUSCULAR | Status: DC | PRN
  Administered 2020-09-07 (×2): 50 ug via INTRAVENOUS
  Administered 2020-09-07: 150 ug via INTRAVENOUS

## 2020-09-07 MED ORDER — ACETAMINOPHEN 650 MG RE SUPP: 650.0000 mg | RECTAL | Status: DC | PRN

## 2020-09-07 MED ORDER — PHENYLEPHRINE HCL-NACL 10-0.9 MG/250ML-% IV SOLN
INTRAVENOUS | Status: DC | PRN
  Administered 2020-09-07: 20 ug/min via INTRAVENOUS

## 2020-09-07 MED ORDER — SUGAMMADEX SODIUM 200 MG/2ML IV SOLN
INTRAVENOUS | Status: DC | PRN
  Administered 2020-09-07: 200 mg via INTRAVENOUS

## 2020-09-07 MED ORDER — OXYCODONE HCL 5 MG PO TABS
5.0000 mg | ORAL_TABLET | ORAL | Status: DC | PRN
  Administered 2020-09-07 – 2020-09-08 (×3): 10 mg via ORAL
  Filled 2020-09-07 (×3): qty 2

## 2020-09-07 MED ORDER — LACTATED RINGERS IV SOLN: INTRAVENOUS | Status: DC | PRN

## 2020-09-07 SURGICAL SUPPLY — 53 items
AGENT HMST KT MTR STRL THRMB (HEMOSTASIS) ×1
APL SKNCLS STERI-STRIP NONHPOA (GAUZE/BANDAGES/DRESSINGS) ×1
BENZOIN TINCTURE PRP APPL 2/3 (GAUZE/BANDAGES/DRESSINGS) ×2 IMPLANT
BIT DRILL SM SPINE QC 14 (BIT) ×1 IMPLANT
BLADE CLIPPER SURG (BLADE) IMPLANT
BONE CC-ACS 11X14 X8 6D (Bone Implant) ×2 IMPLANT
BUR ROUND FLUTED 4 SOFT TCH (BURR) ×2 IMPLANT
CHIPS BONE CANC-ACS 11X14X8 6D (Bone Implant) IMPLANT
COLLAR CERV LO CONTOUR FIRM DE (SOFTGOODS) ×2 IMPLANT
COVER MAYO STAND STRL (DRAPES) ×2 IMPLANT
COVER SURGICAL LIGHT HANDLE (MISCELLANEOUS) ×2 IMPLANT
COVER WAND RF STERILE (DRAPES) ×2 IMPLANT
DRAPE C-ARM 42X72 X-RAY (DRAPES) ×2 IMPLANT
DRAPE HALF SHEET 40X57 (DRAPES) ×2 IMPLANT
DRAPE MICROSCOPE LEICA (MISCELLANEOUS) ×2 IMPLANT
DURAPREP 6ML APPLICATOR 50/CS (WOUND CARE) ×2 IMPLANT
ELECT COATED BLADE 2.86 ST (ELECTRODE) ×2 IMPLANT
ELECT REM PT RETURN 9FT ADLT (ELECTROSURGICAL) ×2
ELECTRODE REM PT RTRN 9FT ADLT (ELECTROSURGICAL) ×1 IMPLANT
EVACUATOR 1/8 PVC DRAIN (DRAIN) ×2 IMPLANT
GAUZE SPONGE 4X4 12PLY STRL (GAUZE/BANDAGES/DRESSINGS) ×2 IMPLANT
GLOVE ORTHO TXT STRL SZ7.5 (GLOVE) ×4 IMPLANT
GLOVE SRG 8 PF TXTR STRL LF DI (GLOVE) ×2 IMPLANT
GLOVE SURG UNDER POLY LF SZ8 (GLOVE) ×4
GOWN STRL REUS W/ TWL LRG LVL3 (GOWN DISPOSABLE) ×1 IMPLANT
GOWN STRL REUS W/ TWL XL LVL3 (GOWN DISPOSABLE) ×1 IMPLANT
GOWN STRL REUS W/TWL 2XL LVL3 (GOWN DISPOSABLE) ×2 IMPLANT
GOWN STRL REUS W/TWL LRG LVL3 (GOWN DISPOSABLE) ×2
GOWN STRL REUS W/TWL XL LVL3 (GOWN DISPOSABLE) ×2
HALTER HD/CHIN CERV TRACTION D (MISCELLANEOUS) ×2 IMPLANT
KIT BASIN OR (CUSTOM PROCEDURE TRAY) ×2 IMPLANT
KIT TURNOVER KIT B (KITS) ×2 IMPLANT
MANIFOLD NEPTUNE II (INSTRUMENTS) ×1 IMPLANT
NDL 25GX 5/8IN NON SAFETY (NEEDLE) ×1 IMPLANT
NEEDLE 25GX 5/8IN NON SAFETY (NEEDLE) ×2 IMPLANT
NS IRRIG 1000ML POUR BTL (IV SOLUTION) ×2 IMPLANT
PACK ORTHO CERVICAL (CUSTOM PROCEDURE TRAY) ×2 IMPLANT
PAD ARMBOARD 7.5X6 YLW CONV (MISCELLANEOUS) ×4 IMPLANT
PATTIES SURGICAL .5 X.5 (GAUZE/BANDAGES/DRESSINGS) IMPLANT
PLATE ANT CERV XTEND 1 LV 16 (Plate) ×1 IMPLANT
POSITIONER HEAD DONUT 9IN (MISCELLANEOUS) ×2 IMPLANT
SCREW XTD VAR 4.2 SELF TAP (Screw) ×6 IMPLANT
STRIP CLOSURE SKIN 1/2X4 (GAUZE/BANDAGES/DRESSINGS) ×2 IMPLANT
SURGIFLO W/THROMBIN 8M KIT (HEMOSTASIS) ×1 IMPLANT
SUT BONE WAX W31G (SUTURE) ×2 IMPLANT
SUT VIC AB 3-0 PS2 18 (SUTURE) ×2
SUT VIC AB 3-0 PS2 18XBRD (SUTURE) ×1 IMPLANT
SUT VIC AB 4-0 PS2 27 (SUTURE) ×2 IMPLANT
SYR BULB EAR ULCER 3OZ GRN STR (SYRINGE) ×1 IMPLANT
SYR BULB IRRIG 60ML STRL (SYRINGE) ×1 IMPLANT
TOWEL GREEN STERILE (TOWEL DISPOSABLE) ×2 IMPLANT
TOWEL GREEN STERILE FF (TOWEL DISPOSABLE) ×2 IMPLANT
WATER STERILE IRR 1000ML POUR (IV SOLUTION) ×2 IMPLANT

## 2020-09-07 NOTE — Telephone Encounter (Signed)
FYI   Copied from CRM [id number]. Topic: General - Other >> Sep 03, 2020  2:36 PM Jaquita Rector A wrote: Reason for CRM: Patient called in thought he had a missed call from Dr Delford Field but nothing noted in chart. Patient wanted to inform Dr Delford Field that he went for Covid testing this morning but was turned away said he was told at the site that he did not need to be tested since his last test was positive. But patient is scheduled for surgery on 09/07/20. Please advise  Ph# [phone] or [phone]

## 2020-09-07 NOTE — Progress Notes (Signed)
Orthopedic Tech Progress Note Patient Details:  Jon Nolan 1986-06-20 [id number] Patient has SOFT COLLAR Patient ID: Jon Nolan, male   DOB: 27-Oct-1985, 35 y.o.   MRN: [id number]   [name] 09/07/2020, 3:20 PM

## 2020-09-07 NOTE — H&P (Signed)
Patient: Jon Nolan                                       Date of Birth: [birth year]                                                    MRN: [id number] Visit Date: 09/02/2020                                                                     Requested by: Storm Frisk, MD [address] [place],  [place] [address] PCP: Storm Frisk, MD   Assessment & Plan: Visit Diagnoses:  1. Neck pain   2. Protrusion of cervical intervertebral disc   3. Stenosis of cervical spine with myelopathy (HCC)     Plan: Patient has cord compression with abnormal findings and myelopathic changes on physical exam.  He needs single level fusion at C4-5 with cord decompression.  Plan would be single level anterior cervical discectomy and fusion with allograft and plate.  Long discussion with the patient that with the cord damage she may not get any improvement or may get mild improvement.  If he continues to have following problems then he will have to have repeat discussion with Dr.Turner and it may be best to avoid anticoagulation due to risk of head injury acute head bleed.  He states she has been straightforward and he understands his risk of CVA is somewhere around  0.6 percent per year.  With patient's myelopathic problem weakness multiple falls we will proceed with decompression on urgent basis.  Plan would be overnight stay use of a soft collar postoperatively.  He understands he will still use the walker after the surgery.  Hopefully he will get some improvement but he understands that when the disc damages the cord he will be left with some permanent damage and improvement with surgery may be minimal, none or possibly more substantial.  Patient understands and requests we proceed.  Risk surgery discussed including pseudoarthrosis reoperation posterior fusion, dysphagia dysphonia.  Follow-Up Instructions: No follow-ups on file.   Orders:     Orders Placed This Encounter  Procedures  . XR  Cervical Spine 2 or 3 views   No orders of the defined types were placed in this encounter.     Procedures: No procedures performed   Clinical Data: No additional findings.   Subjective:    Chief Complaint  Patient presents with  . 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  He states he is trying to push her car out of the ditch and fell on his left shoulder.  He was not able to get up and ambulate and laid in the yard until he was assisted into the house.  Patient not been able to ambulate without assistance and has wide base myelopathic gait.  In certain standing positions he begins to lower extremity shaking and  then falls.  He states he is fallen at least 20 times in the last year.  Also note his atrial fibrillation and he states Dr. Mayford Knife discussed possible anticoagulation after his cervical spine stenosis is surgically corrected.  I do not think she is aware of the number of times he is fallen.  Patient's had follow-up with Dr. Shan Levans.  He was referred for neurology consultation with Dr.Adam Everlena Cooper and MRI showed large prominent disc herniation at C4-5 with severe canal stenosis cord compression and cord edema and contrast-enhancement consistent with compressive myelopathy with cord myelomalacia.  Patient is here for scheduling surgery for his cervical stenosis.  Additional work-up showed head CT with some mild cerebral white matter changes slightly more advanced expected for age no acute intracranial abnormalities.  Thoracic MRI scan showed some very small disc protrusions without compression.  Lumbar MRI scan showed no compression.  All studies were done on 08/13/2020.    Review of Systems positive history of cervical multiple concussions playing football as a teenager.  Stat history of migraines.  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  Positive history for cocaine marijuana cigarette use.  He still smokes some.  Positive for atrial fibrillation.   Objective: Vital Signs: BP (!) 147/97   Pulse 84   Physical Exam Constitutional:      Appearance: He is well-developed and well-nourished.  HENT:     Head: Normocephalic and atraumatic.  Eyes:     Extraocular Movements: EOM normal.     Pupils: Pupils are equal, round, and reactive to light.  Neck:     Thyroid: No thyromegaly.     Trachea: No tracheal deviation.  Cardiovascular:     Rate and Rhythm: Normal rate.  Pulmonary:     Effort: Pulmonary effort is normal.     Breath sounds: No wheezing.  Abdominal:     General: Bowel sounds are normal.     Palpations: Abdomen is soft.  Skin:    General: Skin is warm and dry.     Capillary Refill: Capillary refill takes less than 2 seconds.  Neurological:     Mental Status: He is alert and oriented to person, place, and time.  Psychiatric:        Mood and Affect: Mood and affect normal.        Behavior: Behavior normal.        Thought Content: Thought content normal.        Judgment: Judgment normal.     Ortho Exam patient has hyperreflexia biceps triceps brachial radialis both right and left.  He is Press photographer with a walker.  Bilateral clonus.  Wide-based gait.  He has slight decrease anterior tib gastrocsoleus and quads.  Bilateral Hoffmann.  Toes downgoing.  Clonus nonsustained. Specialty Comments:  No specialty comments available.  Imaging: CLINICAL DATA: Hyper reflexia, weakness.  EXAM: MRI CERVICAL, THORACIC AND LUMBAR SPINE WITHOUT AND WITH CONTRAST  TECHNIQUE: Multiplanar and multiecho pulse sequences of the cervical spine, to include the craniocervical junction and cervicothoracic junction, and thoracic and lumbar spine, were obtained without and with intravenous contrast.  CONTRAST: 75mL GADAVIST GADOBUTROL 1 MMOL/ML IV SOLN  COMPARISON: None.  FINDINGS: MRI CERVICAL SPINE  FINDINGS  Alignment: Straightening of the cervical curvature.  Vertebrae: No fracture, evidence of discitis, or bone lesion.  Cord: Cord compression with cord edema in associated contrast enhancement at C4-5.  Posterior Fossa,  vertebral arteries, paraspinal tissues: Mild mucosal thickening of the sphenoid sinus. Opacification of aerated petrous apices.  Disc levels:  C2-3: No spinal canal or neural foraminal stenosis.  C3-4: Small posterior disc protrusion without significant spinal canal stenosis. Uncovertebral and facet degenerative changes resulting moderate bilateral neural foraminal neck.  C4-5: Prominent posterior disc herniation resulting in severe spinal canal stenosis with cord compression. 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  C5-6: No spinal canal or neural foraminal stenosis.  C6-7: Small posterior disc protrusion. No significant spinal canal or neural foraminal stenosis.  C7-T1: No spinal canal or neural foraminal stenosis.  MRI THORACIC SPINE FINDINGS  Alignment: Physiologic.  Vertebrae: No fracture, evidence of discitis, or bone lesion.  Cord: Normal signal and morphology.  Paraspinal and other soft tissues: Negative.  Disc levels:  Small posterior disc protrusions at T3-4, T4-5 and T6-7. No significant spinal canal or neural foraminal stenosis at any thoracic level.  MRI LUMBAR SPINE FINDINGS  Segmentation: Standard.  Alignment: Physiologic.  Vertebrae: No fracture, evidence of discitis, or bone lesion.  Conus medullaris and cauda equina: Conus extends to the T12 level. Conus and cauda equina appear normal.  Paraspinal and other soft tissues: Negative.  Disc levels:  No significant disc bulge or herniation, spinal canal or neural foraminal stenosis at any level.  IMPRESSION: 1. Prominent posterior disc herniation at C4-5 resulting in severe spinal canal  stenosis with cord compression. 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 2. No significant degenerative changes of the thoracic or lumbar spine.  These results will be called to the ordering clinician or representative by the Radiologist Assistant, and communication documented in the PACS or [employer].   Electronically Signed By: Baldemar Lenis M.D. On: 08/13/2020 18:10   PMFS History:     Patient Active Problem List   Diagnosis Date Noted  . Protrusion of cervical intervertebral disc 09/03/2020  . Stenosis of cervical spine with myelopathy (HCC) 09/03/2020  . Cervical radiculopathy at C5 08/20/2020  . Chronic pain syndrome 08/20/2020  . Cervical disc disease with myelopathy 08/20/2020  . History of cocaine use 07/06/2020  . Primary hypertension 07/06/2020  . Tobacco use 07/06/2020       Past Medical History:  Diagnosis Date  . Asthma   . Headache          Family History  Problem Relation Age of Onset  . Diabetes Mother   . Hypertension Mother   . Hypertension Father   . Cancer Father   . Colon cancer Father   . Lung cancer Father     History reviewed. No pertinent surgical history. Social History        Occupational History  . Not on file  Tobacco Use  . Smoking status: Current Every Day Smoker    Packs/day: 0.25    Types: Cigarettes  . Smokeless tobacco: Never Used  Vaping Use  . Vaping Use: Former  Substance and Sexual Activity  . Alcohol use: Yes    Alcohol/week: 3.0 standard drinks    Types: 3 Cans of beer per week    Comment: 3 week  . Drug use: Yes    Types: Marijuana, Cocaine    Comment: rarely uses cocaine  . Sexual activity: Not Currently

## 2020-09-07 NOTE — Transfer of Care (Addendum)
Immediate Anesthesia Transfer of Care Note  Patient: Jon Nolan  Procedure(s) Performed: CERVICAL FOUR-FIVE ANTERIOR CERVICAL DECOMPRESSION/DISCECTOMY FUSION, ALLOGRAFT PLATE (N/A Spine Cervical)  Patient Location: PACU  Anesthesia Type:General  Level of Consciousness: awake, alert  and oriented  Airway & Oxygen Therapy: Patient Spontanous Breathing and Patient connected to nasal cannula oxygen  Post-op Assessment: Report given to RN, Post -op Vital signs reviewed and stable and Patient moving all extremities X 4  Post vital signs: Reviewed and stable  Last Vitals:  Vitals Value Taken Time  BP 121/96 09/07/20 1512  Temp    Pulse 85 09/07/20 1514  Resp 14 09/07/20 1514  SpO2 98 % 09/07/20 1514  Vitals shown include unvalidated device data.  Last Pain:  Vitals:   09/07/20 1018  TempSrc:   PainSc: 0-No pain         Complications: No complications documented.

## 2020-09-07 NOTE — Op Note (Addendum)
Preop diagnosis: C4-5 cervical stenosis with myelopathy and large disc protrusion.  Postop diagnosis: Same  Procedure: C4-5 anterior cervical discectomy and fusion, allograft and plate.  Surgeon: Annell Greening, MD  Assistant: Zonia Kief, PA-C medically necessary and present for the entire procedure.  Anesthesia: General +6 cc Marcaine local  Drains 1 Hemovac neck.  Implants: Globus 60mm screws times 4 NTF 29mm graft, X-tend 28mm plate.   Brief history: 35 year old male referred to me he used to be a cook at [employer] was trying to push the car out of a ditch and suddenly felt sharp pain in his neck and shoulder and since that time has had great difficulty walking has to use a cane has weakness in his legs wide base gait and numbness in his hands and legs.  He also has atrial fibrillation.  Has had at least 20 episodes of falling over the last 6 months due to lower extremity weakness and gait disturbance.  MRI scan shows large disc protrusion with severe stenosis and cord compression with abnormal cord signal consistent with cord myelopathy.  Procedure: After induction of general seizure orotracheal ovation with a glide scope to avoid manipulation of the neck with patient's cord compression patient was intubated had all traction was applied gel bag underneath the scapula.  Pain cream used for the head.  Arms were tucked to the side with pads over the ulnar nerve.  Neck was prepped with DuraPrep there is squared with towel sterile skin marker Betadine Steri-Drape sterile Mayo stand at the head thyroid sheets and drapes timeout procedure completed Ancef prophylaxis given.  Incision was made starting at the midline extending to the left based on palpable landmarks.  Platysma was divided small lobe vein was coagulated blunt dissection down to the longus Coley where short 25 needle was placed and crosstable sterilely draped C arm showed this to the C5-6 level.  Next level up short 25 needle was placed  confirmed with a second image the permanently market while self-retaining Cloward retractor teeth blades right and left smooth blade cephalad caudad were carefully positioned and placed.  Discectomy was performed off microscope was draped and brought in we proceeded back to the posterior longitudinal ligament additional bone was taken so that an 8 mm graft would be needed to remove enough spurs and give adequate room for good decompression of the cord.  Been severely compressed.  Large chunks of disc were removed the posterior longitudinal ligament was taken down decompressing the dura was completely visualized bulging up into the operative field.  Trial sizers with the sixth and 7 followed by an 8 mm cortical cancellous graft was selected with 7 degrees lordosis.  A millimeter graft was placed while CRNA General traction countersunk 1 to 2 mm.  Some Surgi-Flo was used for epidural bleeding in the epidural space was dry at the time of closure and there is still room around the edge of the graft right and left for egress of any fluid.  C-arm was used to confirm AP and lateral good position plate and graft.  Screws were locked into the tiny locking screwdriver Hemovac was placed in and out technique on the left side using the trocar.  Platysma reapproximated with 3-0 Vicryl.  Zonia Kief, PA was necessary for instrumentation decompression of the cord removal of fragments.  Operative field was dry.  Tincture benzoin Steri-Strips applied after 4-0 subcuticular skin closure Marcaine infiltration 6 cc soft dressing with 4 x 4's tape and soft cervical collar.  Instrument count  needle count was correct patient tolerated procedure well was transferred to care room in stable condition.

## 2020-09-07 NOTE — Interval H&P Note (Signed)
History and Physical Interval Note:  09/07/2020 12:18 PM  Jon Nolan  has presented today for surgery, with the diagnosis of c4-5 herniated nucleus pulposus stenosis with myelopathy.  The various methods of treatment have been discussed with the patient and family. After consideration of risks, benefits and other options for treatment, the patient has consented to  Procedure(s): C4-5 ANTERIOR CERVICAL DECOMPRESSION/DISCECTOMY FUSION, ALLOGRAFT PLATE (N/A) as a surgical intervention.  The patient's history has been reviewed, patient examined, no change in status, stable for surgery.  I have reviewed the patient's chart and labs.  Questions were answered to the patient's satisfaction.     Eldred Manges

## 2020-09-07 NOTE — Anesthesia Procedure Notes (Signed)
Procedure Name: Intubation Date/Time: 09/07/2020 12:57 PM Performed by: Marena Chancy, CRNA Pre-anesthesia Checklist: Patient identified, Emergency Drugs available, Suction available and Patient being monitored Patient Re-evaluated:Patient Re-evaluated prior to induction Oxygen Delivery Method: Circle System Utilized Preoxygenation: Pre-oxygenation with 100% oxygen Induction Type: IV induction Ventilation: Mask ventilation without difficulty Laryngoscope Size: Glidescope and 4 Grade View: Grade I Tube type: Oral Tube size: 7.5 mm Number of attempts: 1 Airway Equipment and Method: Stylet and Oral airway Placement Confirmation: ETT inserted through vocal cords under direct vision,  positive ETCO2 and breath sounds checked- equal and bilateral Tube secured with: Tape Dental Injury: Teeth and Oropharynx as per pre-operative assessment

## 2020-09-07 NOTE — Discharge Instructions (Addendum)
Ok to shower 1 days postop.  Cervical collar must be on at all times even when showering, use extra collar and wrap in saran wrap to shower. After drying off you can re-appy dry collar.   Do not apply any creams or ointments to incision.  Do not remove steri-strips.  Leave dressing on until you return.    No aggressive activity..  Do not bend or turn neck.    No lifting, pushing, pulling.    No driving. Use walker to avoid falling.  Call Your Doctor If Any of These Occur Redness, drainage, or swelling at the wound.  Temperature greater than 101 degrees. Severe pain not relieved by pain medication. Incision starts to come apart.

## 2020-09-07 NOTE — Telephone Encounter (Signed)
Patient seen in office

## 2020-09-07 NOTE — Anesthesia Postprocedure Evaluation (Signed)
Anesthesia Post Note  Patient: Jon Nolan  Procedure(s) Performed: CERVICAL FOUR-FIVE ANTERIOR CERVICAL DECOMPRESSION/DISCECTOMY FUSION, ALLOGRAFT PLATE (N/A Spine Cervical)     Patient location during evaluation: PACU Anesthesia Type: General Level of consciousness: awake and alert Pain management: pain level controlled Vital Signs Assessment: post-procedure vital signs reviewed and stable Respiratory status: spontaneous breathing, nonlabored ventilation, respiratory function stable and patient connected to nasal cannula oxygen Cardiovascular status: blood pressure returned to baseline and stable Postop Assessment: no apparent nausea or vomiting Anesthetic complications: no   No complications documented.  Last Vitals:  Vitals:   09/07/20 1705 09/07/20 1706  BP: (!) 127/98 (!) 127/98  Pulse: 94 91  Resp: 19 19  Temp: 36.4 C 36.4 C  SpO2: 100% 100%    Last Pain:  Vitals:   09/07/20 1920  TempSrc:   PainSc: 3                  Jacalynn Buzzell P Haakon Titsworth

## 2020-09-07 NOTE — Progress Notes (Signed)
PACU exam :   MAE. Can lift right and left leg. bilat 4 plus reflexes , no sustained clonus.  bilat UE numbness and tingling unchanged. Grip right and left .   Myelopathy unchanged . OOB with therapy in AM with walker. He has walker and cane . Would be best with walker due to falling Hx.   Not good risk for anticoag. Due to falling Hx and risk of Head bleed. Will discuss with Dr. Mayford Knife in one week post op.

## 2020-09-08 ENCOUNTER — Encounter (HOSPITAL_COMMUNITY): Payer: Self-pay | Admitting: Orthopaedic Surgery

## 2020-09-08 LAB — DRUG SCREEN 764883 11+OXYCO+ALC+CRT-BUND
Amphetamines, Urine: NEGATIVE ng/mL
BENZODIAZ UR QL: NEGATIVE ng/mL
Barbiturate: NEGATIVE ng/mL
Cocaine (Metabolite): NEGATIVE ng/mL
Creatinine: 157.8 mg/dL (ref 20.0–300.0)
Ethanol: NEGATIVE %
Meperidine: NEGATIVE ng/mL
Methadone Screen, Urine: NEGATIVE ng/mL
OPIATE SCREEN URINE: NEGATIVE ng/mL
Phencyclidine: NEGATIVE ng/mL
Propoxyphene: NEGATIVE ng/mL
Tramadol: NEGATIVE ng/mL
pH, Urine: 5.7 (ref 4.5–8.9)

## 2020-09-08 LAB — CANNABINOID CONFIRMATION, UR
CANNABINOIDS: POSITIVE — AB
Carboxy THC GC/MS Conf: 268 ng/mL

## 2020-09-08 LAB — OXYCODONE/OXYMORPHONE, CONFIRM
OXYCODONE/OXYMORPH: POSITIVE — AB
OXYCODONE: 941 ng/mL
OXYCODONE: POSITIVE — AB
OXYMORPHONE (GC/MS): 568 ng/mL
OXYMORPHONE: POSITIVE — AB

## 2020-09-08 NOTE — Evaluation (Signed)
Occupational Therapy Evaluation Patient Details Name: Jon Nolan MRN: [id number] DOB: 06-29-1986 Today's Date: 09/08/2020    History of Present Illness 35 yo male s/p C4-5 ACDF. PMH including stenosis of cervical spone with myelopathy, HTN, tobacco use, and history of cocaine use.   Clinical Impression   PTA, pt was living with his fiance and was requiring assistance for ADLs and using rollator for mobility with multiple falls due to BLE weakness. Pt currently performing LB ADLs and functional mobility with Min guard A. Providing education on cervical precautions and compensatory techniques for UB ADLs, LB ADLs, toileting, and tub transfer. Answered all pt questions. Recommend dc home once medically stable per physician. All acute OT needs met and will sign off. Thank you.    Follow Up Recommendations  No OT follow up;Other (comment) (Possible follow up with OP OT for FM pending progress)    Equipment Recommendations  3 in 1 bedside commode    Recommendations for Other Services PT consult     Precautions / Restrictions Precautions Precautions: Cervical Precaution Booklet Issued: Yes (comment) Precaution Comments: Education and handout on precautions and compensatory techniques Required Braces or Orthoses: Cervical Brace Cervical Brace: Soft collar;At all times      Mobility Bed Mobility Overal bed mobility: Needs Assistance Bed Mobility: Rolling;Sidelying to Sit Rolling: Supervision Sidelying to sit: Supervision       General bed mobility comments: Supervision for safety. Min cues for cervical precautions    Transfers Overall transfer level: Needs assistance Equipment used: None Transfers: Sit to/from Stand Sit to Stand: Min guard         General transfer comment: Min Guard A for safety    Balance Overall balance assessment: Needs assistance Sitting-balance support: No upper extremity supported;Feet supported Sitting balance-Leahy Scale: Good      Standing balance support: No upper extremity supported;During functional activity Standing balance-Leahy Scale: Fair                             ADL either performed or assessed with clinical judgement   ADL Overall ADL's : Needs assistance/impaired Eating/Feeding: Set up;Sitting   Grooming: Set up;Supervision/safety;Standing Grooming Details (indicate cue type and reason): Educating pt on compensatory techniques for oral care Upper Body Bathing: Supervision/ safety;Set up;Sitting   Lower Body Bathing: Min guard;Sit to/from stand   Upper Body Dressing : Supervision/safety;Set up;Sitting Upper Body Dressing Details (indicate cue type and reason): Educating pt on donning/doffing of shirt and collar. Pt demonstrating understanding with Min cues for cervical precautions. Lower Body Dressing: Min guard;Sit to/from stand   Toilet Transfer: Supervision/safety;Ambulation (simulated to recliner; SPC)     Toileting - Clothing Manipulation Details (indicate cue type and reason): Educating pt on compensatory techniques for peri care     Functional mobility during ADLs: Min guard;Rolling walker (spc) General ADL Comments: Providing education on compensatory techniques for cervical precautions, collar management, bed mobility, UB ADLs, LB ADLs, toileting, and tub transfer. Pt performing at [place]      Pertinent Vitals/Pain Pain Assessment: Faces Faces Pain Scale: Hurts a little bit Pain Intervention(s): Monitored during session     Hand Dominance Right   Extremity/Trunk Assessment Upper Extremity Assessment Upper Extremity Assessment: RUE deficits/detail;LUE deficits/detail RUE Deficits / Details: Able to perform composite flexion/extension and finger opposition. Decreased pinch strength. RUE Coordination: decreased gross  motor;decreased fine motor LUE Deficits / Details: Able to perform composite  flexion/extension and finger opposition. Decreased pinch strength. Decreased grasp strength compared to RUE LUE Coordination: decreased gross motor;decreased fine motor   Lower Extremity Assessment Lower Extremity Assessment: Defer to PT evaluation   Cervical / Trunk Assessment Cervical / Trunk Assessment: Other exceptions Cervical / Trunk Exceptions: s/p cervical sx   Communication Communication Communication: No difficulties   Cognition Arousal/Alertness: Awake/alert Behavior During Therapy: WFL for tasks assessed/performed Overall Cognitive Status: Within Functional Limits for tasks assessed                                 General Comments: Slightly impulsive but feel this is baseline personality   General Comments       Exercises     Shoulder Instructions      Home Living Family/patient expects to be discharged to:: Private residence Living Arrangements: Spouse/significant other Available Help at Discharge: Family;Available PRN/intermittently Type of Home: House Home Access: Stairs to enter [employer] of Steps: 3 (no rails) and 6 steps (rails on both) Entrance Stairs-Rails: Right;Left Home Layout: One level     Bathroom Shower/Tub: Teacher, early years/pre: Standard     Home Equipment: Cane - single point;Walker - 4 wheels          Prior Functioning/Environment Level of Independence: Needs assistance  Gait / Transfers Assistance Needed: Use of rollator and cane. 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(?)        OT Problem List: Decreased strength;Decreased range of motion;Decreased activity tolerance;Impaired balance (sitting and/or standing)      OT Treatment/Interventions:      OT Goals(Current goals can be found in the care plan section) Acute Rehab OT Goals Patient Stated Goal: Go home today, and  return to working/cooking OT Goal Formulation: All assessment and education complete, DC therapy  OT Frequency:     Barriers to D/C:            Co-evaluation              AM-PAC OT "6 Clicks" Daily Activity     Outcome Measure Help from another person eating meals?: None Help from another person taking care of personal grooming?: None Help from another person toileting, which includes using toliet, bedpan, or urinal?: A Little Help from another person bathing (including washing, rinsing, drying)?: A Little Help from another person to put on and taking off regular upper body clothing?: None Help from another person to put on and taking off regular lower body clothing?: A Little 6 Click Score: 21   End of Session Equipment Utilized During Treatment: Rolling walker;Cervical collar Nurse Communication: Mobility status  Activity Tolerance: Patient tolerated treatment well Patient left: in chair;with call bell/phone within reach  OT Visit Diagnosis: Unsteadiness on feet (R26.81);Other abnormalities of gait and mobility (R26.89);Muscle weakness (generalized) (M62.81);Repeated falls (R29.6);Pain Pain - part of body:  (Neck)                Time: 6433-2951 OT Time Calculation (min): 22 min Charges:  OT General Charges $OT Visit: 1 Visit OT Evaluation $OT Eval Low Complexity: [place], OTR/L Acute Rehab Pager: [phone] Office: [place] 09/08/2020, 9:07 AM

## 2020-09-08 NOTE — TOC Progression Note (Signed)
Transition of Care [facility]) - Progression Note    Patient Details  Name: Jon Nolan MRN: [id number] Date of Birth: 12/21/1985  [facility]) CM/SW Contact  Beckie Busing, RN Phone Number: [phone]  09/08/2020, 8:53 AM  Clinical Narrative:    [facility] consulted for DME needs for uninsured patient. [employer] notified for charity 3 in 1 and rolling walker. Equipment has been ordered and will be delivered per [employer]. No other needs noted at this time . TOC will sign off.         Expected Discharge Plan and Services           Expected Discharge Date: 09/08/20                                     Social Determinants of Health (SDOH) Interventions    Readmission Risk Interventions No flowsheet data found.

## 2020-09-08 NOTE — Progress Notes (Signed)
Orthopedic Tech Progress Note Patient Details:  Jon Nolan 07-26-1986 [id number] RN called requesting an EXTRA SOFT COLLAR  Ortho Devices Type of Ortho Device: Soft collar Ortho Device/Splint Interventions: Other (comment)   Post Interventions Patient Tolerated: Other (comment) Instructions Provided: Other (comment)   [name] 09/08/2020, 8:39 AM

## 2020-09-08 NOTE — Evaluation (Signed)
Physical Therapy Evaluation Patient Details Name: Jon Nolan MRN: [id number] DOB: June 27, 1986 Today's Date: 09/08/2020   History of Present Illness  35 yo male s/p C4-5 ACDF. PMH including stenosis of cervical spone with myelopathy, HTN, tobacco use, and history of cocaine use.    Clinical Impression  Pt admitted with above diagnosis. At the time of PT eval, pt was able to demonstrate transfers and ambulation with gross supervision for safety and RW for support. Strongly encouraged RW use due to antalgia and high risk for injury if pt were to have a fall at home. If pt unable to have home health services, recommend outpatient follow-up when MD feels it is appropriate per post-op protocol. Pt was educated on precautions, brace application/wearing schedule, appropriate activity progression, and car transfer. Pt currently with functional limitations due to the deficits listed below (see PT Problem List). Pt will benefit from skilled PT to increase their independence and safety with mobility to allow discharge to the venue listed below.       Follow Up Recommendations Home health PT;Supervision for mobility/OOB    Equipment Recommendations  Rolling walker with 5" wheels;3in1 (PT)    Recommendations for Other Services       Precautions / Restrictions Precautions Precautions: Cervical Precaution Booklet Issued: Yes (comment) Precaution Comments: Education and handout on precautions and compensatory techniques Required Braces or Orthoses: Cervical Brace Cervical Brace: Soft collar;At all times Restrictions Weight Bearing Restrictions: No      Mobility  Bed Mobility Overal bed mobility: Needs Assistance Bed Mobility: Rolling;Sidelying to Sit Rolling: Supervision Sidelying to sit: Supervision       General bed mobility comments: Pt received sitting up in the recliner chair. Reinforced log roll education verbally.    Transfers Overall transfer level: Needs assistance Equipment  used: Rolling walker (2 wheeled) Transfers: Sit to/from Stand Sit to Stand: Supervision         General transfer comment: Close guard for safety and VC's for optimal posture.  Ambulation/Gait Ambulation/Gait assistance: Supervision Gait Distance (Feet): 250 Feet Assistive device: Rolling walker (2 wheeled) Gait Pattern/deviations: Step-through pattern;Decreased stride length;Antalgic;Trunk flexed Gait velocity: Decreased Gait velocity interpretation: <1.31 ft/sec, indicative of household ambulator General Gait Details: Mildly unsteady at times but improved with cues for improved posture and safe RW use.  Stairs Stairs: Yes Stairs assistance: Min guard Stair Management: One rail Right;Step to pattern;Forwards Number of Stairs: 6    Wheelchair Mobility    Modified Rankin (Stroke Patients Only)       Balance Overall balance assessment: Needs assistance Sitting-balance support: No upper extremity supported;Feet supported Sitting balance-Leahy Scale: Good     Standing balance support: No upper extremity supported;During functional activity Standing balance-Leahy Scale: Fair                               Pertinent Vitals/Pain Pain Assessment: Faces Faces Pain Scale: Hurts a little bit Pain Intervention(s): Monitored during session    Home Living Family/patient expects to be discharged to:: Private residence Living Arrangements: Spouse/significant other Available Help at Discharge: Family;Available PRN/intermittently Type of Home: House Home Access: Stairs to enter Entrance Stairs-Rails: Right;Left Entrance Stairs-Number of Steps: 3 (no rails) and 6 steps (rails on both) Home Layout: One level Home Equipment: Cane - single point;Walker - 4 wheels      Prior Function Level of Independence: Needs assistance   Gait / Transfers Assistance Needed: Use of rollator and cane. 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 Decreased pinch strength. RUE Coordination: decreased gross motor;decreased fine motor LUE Deficits / Details: Able to perform composite flexion/extension and finger opposition. Decreased pinch strength. Decreased grasp strength compared to RUE LUE Coordination: decreased gross motor;decreased fine motor    Lower Extremity Assessment Lower Extremity Assessment: Generalized weakness (Myelopathic)    Cervical / Trunk Assessment Cervical / Trunk Assessment: Other exceptions Cervical / Trunk Exceptions: s/p cervical sx  Communication   Communication: No difficulties  Cognition Arousal/Alertness: Awake/alert Behavior During Therapy: WFL for tasks assessed/performed Overall Cognitive Status: Within Functional Limits for tasks assessed                                 General Comments: Slightly impulsive but feel this is baseline personality      General Comments      Exercises     Assessment/Plan    PT Assessment Patient needs continued PT services  PT Problem List Decreased strength;Decreased range of motion;Decreased activity tolerance;Decreased balance;Decreased mobility;Decreased knowledge of use of DME;Decreased safety awareness;Decreased knowledge of precautions;Pain       PT Treatment Interventions DME instruction;Gait training;Stair training;Functional mobility training;Therapeutic activities;Therapeutic exercise;Neuromuscular re-education;Patient/family education    PT Goals (Current goals can be found in the Care Plan section)  Acute Rehab PT Goals Patient Stated Goal: Go home today, and return to working/cooking PT Goal Formulation:  With patient Time For Goal Achievement: 09/15/20 Potential to Achieve Goals: Good    Frequency Min 5X/week   Barriers to discharge        Co-evaluation               AM-PAC PT "6 Clicks" Mobility  Outcome Measure Help needed turning from your back to your side while in a flat bed without using bedrails?: None Help needed moving from lying on your back to sitting on the side of a flat bed without using bedrails?: None Help needed moving to and from a bed to a chair (including a wheelchair)?: None Help needed standing up from a chair using your arms (e.g., wheelchair or bedside chair)?: A Little Help needed to walk in hospital room?: A Little Help needed climbing 3-5 steps with a railing? : A Little 6 Click Score: 21    End of Session Equipment Utilized During Treatment: Gait belt;Cervical collar Activity Tolerance: Patient tolerated treatment well Patient left: in chair;with call bell/phone within reach Nurse Communication: Mobility status PT Visit Diagnosis: Unsteadiness on feet (R26.81);Pain;Muscle weakness (generalized) (M62.81);Other symptoms and signs involving the nervous system (R29.898) Pain - part of body:  (neck)    Time: 4034-7425 PT Time Calculation (min) (ACUTE ONLY): 24 min   Charges:   PT Evaluation $PT Eval Low Complexity: 1 Low PT Treatments $Gait Training: 8-22 mins        Conni Slipper, PT, DPT Acute Rehabilitation Services Pager: [phone] Office: [phone]   Marylynn Pearson 09/08/2020, 10:12 AM

## 2020-09-08 NOTE — Progress Notes (Signed)
Patient alert and oriented, voiding adequately with no c/o pain or discomfort at the time of discharged. Patient stated understanding of discharged instructions given. Patient was also given DMEs (FRW and 3in1.

## 2020-09-08 NOTE — Progress Notes (Signed)
   Subjective: 1 Day Post-Op Procedure(s) (LRB): CERVICAL FOUR-FIVE ANTERIOR CERVICAL DECOMPRESSION/DISCECTOMY FUSION, ALLOGRAFT PLATE (N/A) Patient reports pain as mild.    Objective: Vital signs in last 24 hours: Temp:  [97 F (36.1 C)-97.8 F (36.6 C)] 97.8 F (36.6 C) (02/01 0434) Pulse Rate:  [66-111] 94 (02/01 0434) Resp:  [12-20] 18 (02/01 0434) BP: (112-137)/(79-100) 118/86 (02/01 0434) SpO2:  [97 %-100 %] 98 % (02/01 0434) Weight:  [71.2 kg] 71.2 kg (01/31 1015)  Intake/Output from previous day: 01/31 0701 - 02/01 0700 In: 1200 [I.V.:1200] Out: 95 [Drains:20; Blood:75] Intake/Output this shift: No intake/output data recorded.  Recent Labs    09/07/20 1101  HGB 14.1   Recent Labs    09/07/20 1101  WBC 8.6  RBC 4.91  HCT 43.4  PLT 235   Recent Labs    09/07/20 1101  NA 134*  K 3.6  CL 104  CO2 21*  BUN 10  CREATININE 1.18  GLUCOSE 89  CALCIUM 8.8*   No results for input(s): LABPT, INR in the last 72 hours.  states he is walking better and now can lift arms above head without both arms going numb. still weak but improved.  DG Cervical Spine 2-3 Views  Result Date: 09/07/2020 CLINICAL DATA:  C4-C5 ACDF EXAM: CERVICAL SPINE - 2-3 VIEW; DG C-ARM 1-60 MIN COMPARISON:  09/02/2020 FLUOROSCOPY TIME:  0 minutes 12 seconds Dose: 0.82 mGy Images: 3 FINDINGS: First image at 1330 hours demonstrates metallic instrument via anterior approach projecting anterior to C4-C5 disc space. A linear metallic body projects over the C4-C5 disc space. Subsequent image demonstrates placement of anterior plate and screws with intervening bone plug at C4-C5. No fracture or subluxation. IMPRESSION: Anterior fusion C4-C5. Electronically Signed   By: Ulyses Southward M.D.   On: 09/07/2020 15:27   DG C-Arm 1-60 Min  Result Date: 09/07/2020 CLINICAL DATA:  C4-C5 ACDF EXAM: CERVICAL SPINE - 2-3 VIEW; DG C-ARM 1-60 MIN COMPARISON:  09/02/2020 FLUOROSCOPY TIME:  0 minutes 12 seconds Dose: 0.82  mGy Images: 3 FINDINGS: First image at 1330 hours demonstrates metallic instrument via anterior approach projecting anterior to C4-C5 disc space. A linear metallic body projects over the C4-C5 disc space. Subsequent image demonstrates placement of anterior plate and screws with intervening bone plug at C4-C5. No fracture or subluxation. IMPRESSION: Anterior fusion C4-C5. Electronically Signed   By: Ulyses Southward M.D.   On: 09/07/2020 15:27    Assessment/Plan: 1 Day Post-Op Procedure(s) (LRB): CERVICAL FOUR-FIVE ANTERIOR CERVICAL DECOMPRESSION/DISCECTOMY FUSION, ALLOGRAFT PLATE (N/A) Plan: discharge home. Drain removed. Dressing change by RN.  Folding walker for home. If his walking improves enough that he stops falling then OK for anticoag for a fib. If he continues to fall may be too high a risk . ROV 1-2 wks.   Eldred Manges 09/08/2020, 7:57 AM

## 2020-09-09 ENCOUNTER — Telehealth: Payer: Self-pay | Admitting: Critical Care Medicine

## 2020-09-09 ENCOUNTER — Telehealth: Payer: Self-pay | Admitting: Orthopaedic Surgery

## 2020-09-09 ENCOUNTER — Telehealth: Payer: Self-pay

## 2020-09-09 NOTE — Telephone Encounter (Signed)
Patient wanted to inform PCP "vulgar surgery" was successful and wanted to speak with Dr. Delford Field regarding the surgery.   Patient states he's experiencing sore throat from the surgery.   Patient scheduled appointment for next available which is 10/20/2020, please advise if there's any sooner appt.

## 2020-09-09 NOTE — Telephone Encounter (Signed)
I spoke to this patient already and told him to call ortho surgery

## 2020-09-09 NOTE — Telephone Encounter (Signed)
Patient called advised [facility] advised him he could not pick up his pain medicine until 09/12/2020. Patient said he only have 5 tabs left from his Rx from the hospital. Patient said his PCP advised that he call Dr Ophelia Charter so that he can override and get the Rx filled before 09/12/2020. The number  To contact patient [phone]

## 2020-09-09 NOTE — Telephone Encounter (Signed)
Spoke to the patient   He needs his march appt moved up to later this month

## 2020-09-09 NOTE — Telephone Encounter (Signed)
I called discussed. He can cut 10mg  in half until 2/5 when he can get the other Rx picked up. FYI

## 2020-09-09 NOTE — Telephone Encounter (Signed)
Transition Care Management Follow-up Telephone Call  Date of discharge and from where: 09/08/2020, [facility]   How have you been since you were released from the hospital? He said he is dong" fine" just pain in his throat from the anesthesia.    He said that he has 2 cervical collars and he is to wear one at all times.  He is not to remove the surgical dressing but stated that the surgical area has been itching.    He wants to let Dr Delford Field know that he is " almost there" with quitting smoking.  He is down to 3-4 cigarettes /day.   Any questions or concerns? Yes - he wants to know when he can receive the COVID vaccine. He would like to get it as soon as possible. Tested COVID + 08/25/2020.   Items Reviewed:  Did the pt receive and understand the discharge instructions provided? Yes   Medications obtained and verified? Yes  - he said that the pharmacy would not fill the prescription for percocet 5-325 until 09/12/2020.  He has some percocet 10-325 and has taken that for the throat pain.   Other? No   Any new allergies since your discharge? No   Do you have support at home? Yes   Home Care and Equipment/Supplies: Were home health services ordered? no If so, what is the name of the agency? n/a  Has the agency set up a time to come to the patient's home?n/a Were any new equipment or medical supplies ordered?  Yes: RW and 3:1 commode What is the name of the medical supply agency? [employer]  Were you able to get the supplies/equipment? Yes Do you have any questions related to the use of the equipment or supplies? No  Functional Questionnaire: (I = Independent and D = Dependent) ADLs: independent. Has RW to use with ambulation,   Follow up appointments reviewed:   PCP Hospital f/u appt confirmed? Yes Dr Delford Field  - 10/20/2020.  There were no appointments any sooner and he only wants to be seen by Dr Delford Field.  Specialist Hospital f/u appt confirmed? Yes  - orthopedic surgeon  09/16/2020; cardiology  - 11/03/2020.   Are transportation arrangements needed? No   If their condition worsens, is the pt aware to call PCP or go to the Emergency Dept.?yes  Was the patient provided with contact information for the PCP's office or ED? He has the phone number for [facility]  Was to pt encouraged to call back with questions or concerns? yes

## 2020-09-09 NOTE — Telephone Encounter (Signed)
noted 

## 2020-09-09 NOTE — Telephone Encounter (Signed)
Patient would like  PCP to call him regarding the wound on his throat stating he's experiencing irration/itchinees. Patient also states orthopedic advised him to cut oxyCODONE-acetaminophen (PERCOCET/ROXICET) 5-325 MG tablet in half until 09/12/2020. On 09/12/2020 patient insurance will approve another refill.   Marland Kitchen

## 2020-09-09 NOTE — Telephone Encounter (Signed)
Please see message from patient below. Insurance will not pay because the patient got Oxycodone 10/325 #40 from Dr. Delford Field on 09/03/2020. This was written as 1 po q 6 hours prn, so it would be a 10 day supply.  Patient was written for Oxycodone 5/325 by Fayrene Fearing post op.  Pharmacy will have to get ok from you to fill.  Insurance may not cover as it is still too early.   Please advise.

## 2020-09-10 NOTE — Telephone Encounter (Signed)
I spoke to the patient  Some mild itching on wound otherwise is ok  Please move his march 15 appt up to this month, end of month ok

## 2020-09-11 NOTE — Discharge Summary (Signed)
Patient ID: DALEY ALARIE MRN: [id number] DOB/AGE: 1986-08-01 35 y.o.  Admit date: 09/07/2020 Discharge date: 09/08/2020 Admission Diagnoses:  Active Problems:   Cervical spinal stenosis   Discharge Diagnoses:  Active Problems:   Cervical spinal stenosis  status post Procedure(s): CERVICAL FOUR-FIVE ANTERIOR CERVICAL DECOMPRESSION/DISCECTOMY FUSION, ALLOGRAFT PLATE  Past Medical History:  Diagnosis Date  . Asthma   . Atrial fibrillation (HCC)   . COVID-19 08/25/2020  . Hypertension   . Pre-diabetes     Surgeries: Procedure(s): CERVICAL FOUR-FIVE ANTERIOR CERVICAL DECOMPRESSION/DISCECTOMY FUSION, ALLOGRAFT PLATE on 8/85/0277   Consultants:   Discharged Condition: Improved  Hospital Course: SI SNIDER is an 35 y.o. male who was admitted 09/07/2020 for operative treatment of cervical stenosis/HNP. Patient failed conservative treatments (please see the history and physical for the specifics) and had severe unremitting pain that affects sleep, daily activities and work/hobbies. After pre-op clearance, the patient was taken to the operating room on 09/07/2020 and underwent  Procedure(s): CERVICAL FOUR-FIVE ANTERIOR CERVICAL DECOMPRESSION/DISCECTOMY FUSION, ALLOGRAFT PLATE.    Patient was given perioperative antibiotics:  Anti-infectives (From admission, onward)   Start     Dose/Rate Route Frequency Ordered Stop   09/07/20 2100  ceFAZolin (ANCEF) IVPB 1 g/50 mL premix        1 g 100 mL/hr over 30 Minutes Intravenous Every 8 hours 09/07/20 1659 09/08/20 0331   09/07/20 1000  ceFAZolin (ANCEF) IVPB 2g/100 mL premix        2 g 200 mL/hr over 30 Minutes Intravenous On call to O.R. 09/07/20 0953 09/07/20 1300       Patient was given sequential compression devices and early ambulation to prevent DVT.   Patient benefited maximally from hospital stay and there were no complications. At the time of discharge, the patient was urinating/moving their bowels without difficulty,  tolerating a regular diet, pain is controlled with oral pain medications and they have been cleared by PT/OT.   Recent vital signs: No data found.   Recent laboratory studies: No results for input(s): WBC, HGB, HCT, PLT, NA, K, CL, CO2, BUN, CREATININE, GLUCOSE, INR, CALCIUM in the last 72 hours.  Invalid input(s): PT, 2   Discharge Medications:   Allergies as of 09/08/2020   No Known Allergies     Medication List    STOP taking these medications   oxyCODONE-acetaminophen 10-325 MG tablet Commonly known as: PERCOCET Replaced by: oxyCODONE-acetaminophen 5-325 MG tablet     TAKE these medications   BLACK ELDERBERRY PO Take 1 each by mouth daily. With Vit E and Zinc   diltiazem 240 MG 24 hr capsule Commonly known as: Cardizem CD Take 1 capsule (240 mg total) by mouth daily.   losartan 25 MG tablet Commonly known as: COZAAR Take 1 tablet (25 mg total) by mouth daily.   oxyCODONE-acetaminophen 5-325 MG tablet Commonly known as: PERCOCET/ROXICET Take 1-2 tablets by mouth every 6 (six) hours as needed for severe pain. Replaces: oxyCODONE-acetaminophen 10-325 MG tablet       Diagnostic Studies: DG Chest 2 View  Result Date: 08/25/2020 CLINICAL DATA:  New onset atrial fibrillation EXAM: CHEST - 2 VIEW COMPARISON:  None. FINDINGS: The heart size and mediastinal contours are within normal limits. Both lungs are clear. The visualized skeletal structures are unremarkable. IMPRESSION: No active cardiopulmonary disease. Electronically Signed   By: Alcide Clever M.D.   On: 08/25/2020 17:45   DG Cervical Spine 2-3 Views  Result Date: 09/07/2020 CLINICAL DATA:  C4-C5 ACDF EXAM: CERVICAL SPINE -  2-3 VIEW; DG C-ARM 1-60 MIN COMPARISON:  09/02/2020 FLUOROSCOPY TIME:  0 minutes 12 seconds Dose: 0.82 mGy Images: 3 FINDINGS: First image at 1330 hours demonstrates metallic instrument via anterior approach projecting anterior to C4-C5 disc space. A linear metallic body projects over the C4-C5 disc  space. Subsequent image demonstrates placement of anterior plate and screws with intervening bone plug at C4-C5. No fracture or subluxation. IMPRESSION: Anterior fusion C4-C5. Electronically Signed   By: Ulyses Southward M.D.   On: 09/07/2020 15:27   MR CERVICAL SPINE W WO CONTRAST  Result Date: 08/13/2020 CLINICAL DATA:  Hyper reflexia, weakness. EXAM: MRI CERVICAL, THORACIC AND LUMBAR SPINE WITHOUT AND WITH CONTRAST TECHNIQUE: Multiplanar and multiecho pulse sequences of the cervical spine, to include the craniocervical junction and cervicothoracic junction, and thoracic and lumbar spine, were obtained without and with intravenous contrast. CONTRAST:  17mL GADAVIST GADOBUTROL 1 MMOL/ML IV SOLN COMPARISON:  None. FINDINGS: MRI CERVICAL SPINE FINDINGS Alignment: Straightening of the cervical curvature. Vertebrae: No fracture, evidence of discitis, or bone lesion. Cord: Cord compression with cord edema in associated contrast enhancement at C4-5. Posterior Fossa, vertebral arteries, paraspinal tissues: Mild mucosal thickening of the sphenoid sinus. Opacification of aerated petrous apices. Disc levels: C2-3: No spinal canal or neural foraminal stenosis. C3-4: Small posterior disc protrusion without significant spinal canal stenosis. Uncovertebral and facet degenerative changes resulting moderate bilateral neural foraminal neck. C4-5: Prominent posterior disc herniation resulting in severe spinal canal stenosis with cord compression. 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 C5-6: No spinal canal or neural foraminal stenosis. C6-7: Small posterior disc protrusion. No significant spinal canal or neural foraminal stenosis. C7-T1: No spinal canal or neural foraminal stenosis. MRI THORACIC SPINE FINDINGS Alignment:  Physiologic. Vertebrae: No fracture, evidence of discitis, or bone lesion. Cord:  Normal signal and morphology. Paraspinal and other soft tissues: Negative. Disc levels:  Small posterior disc protrusions at T3-4, T4-5 and T6-7. No significant spinal canal or neural foraminal stenosis at any thoracic level. MRI LUMBAR SPINE FINDINGS Segmentation:  Standard. Alignment:  Physiologic. Vertebrae:  No fracture, evidence of discitis, or bone lesion. Conus medullaris and cauda equina: Conus extends to the T12 level. Conus and cauda equina appear normal. Paraspinal and other soft tissues: Negative. Disc levels: No significant disc bulge or herniation, spinal canal or neural foraminal stenosis at any level. IMPRESSION: 1. Prominent posterior disc herniation at C4-5 resulting in severe spinal canal stenosis with cord compression. 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 2. No significant degenerative changes of the thoracic or lumbar spine. These results will be called to the ordering clinician or representative by the Radiologist Assistant, and communication documented in the PACS or [employer]. Electronically Signed   By: Baldemar Lenis M.D.   On: 08/13/2020 18:10   MR THORACIC SPINE W WO CONTRAST  Result Date: 08/13/2020 CLINICAL DATA:  Hyper reflexia, weakness. EXAM: MRI CERVICAL, THORACIC AND LUMBAR SPINE WITHOUT AND WITH CONTRAST TECHNIQUE: Multiplanar and multiecho pulse sequences of the cervical spine, to include the craniocervical junction and cervicothoracic junction, and thoracic and lumbar spine, were obtained without and with intravenous contrast. CONTRAST:  34mL GADAVIST GADOBUTROL 1 MMOL/ML IV SOLN COMPARISON:  None. FINDINGS: MRI CERVICAL SPINE FINDINGS Alignment: Straightening of the cervical curvature. Vertebrae: No fracture, evidence of discitis, or bone lesion. Cord: Cord compression with cord edema in associated contrast enhancement at C4-5. Posterior Fossa, vertebral arteries, paraspinal tissues: Mild mucosal thickening of  the sphenoid sinus. Opacification of aerated petrous apices. Disc levels:  C2-3: No spinal canal or neural foraminal stenosis. C3-4: Small posterior disc protrusion without significant spinal canal stenosis. Uncovertebral and facet degenerative changes resulting moderate bilateral neural foraminal neck. C4-5: Prominent posterior disc herniation resulting in severe spinal canal stenosis with cord compression. 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 C5-6: No spinal canal or neural foraminal stenosis. C6-7: Small posterior disc protrusion. No significant spinal canal or neural foraminal stenosis. C7-T1: No spinal canal or neural foraminal stenosis. MRI THORACIC SPINE FINDINGS Alignment:  Physiologic. Vertebrae: No fracture, evidence of discitis, or bone lesion. Cord:  Normal signal and morphology. Paraspinal and other soft tissues: Negative. Disc levels: Small posterior disc protrusions at T3-4, T4-5 and T6-7. No significant spinal canal or neural foraminal stenosis at any thoracic level. MRI LUMBAR SPINE FINDINGS Segmentation:  Standard. Alignment:  Physiologic. Vertebrae:  No fracture, evidence of discitis, or bone lesion. Conus medullaris and cauda equina: Conus extends to the T12 level. Conus and cauda equina appear normal. Paraspinal and other soft tissues: Negative. Disc levels: No significant disc bulge or herniation, spinal canal or neural foraminal stenosis at any level. IMPRESSION: 1. Prominent posterior disc herniation at C4-5 resulting in severe spinal canal stenosis with cord compression. 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 2. No significant degenerative changes of the thoracic or lumbar spine. These results will be called to the ordering clinician or representative by the Radiologist Assistant, and communication documented in the PACS or [employer]. Electronically Signed   By: Baldemar LenisKatyucia  De Macedo Rodrigues M.D.   On: 08/13/2020 18:10   MR LUMBAR SPINE W WO  CONTRAST  Result Date: 08/13/2020 CLINICAL DATA:  Hyper reflexia, weakness. EXAM: MRI CERVICAL, THORACIC AND LUMBAR SPINE WITHOUT AND WITH CONTRAST TECHNIQUE: Multiplanar and multiecho pulse sequences of the cervical spine, to include the craniocervical junction and cervicothoracic junction, and thoracic and lumbar spine, were obtained without and with intravenous contrast. CONTRAST:  7mL GADAVIST GADOBUTROL 1 MMOL/ML IV SOLN COMPARISON:  None. FINDINGS: MRI CERVICAL SPINE FINDINGS Alignment: Straightening of the cervical curvature. Vertebrae: No fracture, evidence of discitis, or bone lesion. Cord: Cord compression with cord edema in associated contrast enhancement at C4-5. Posterior Fossa, vertebral arteries, paraspinal tissues: Mild mucosal thickening of the sphenoid sinus. Opacification of aerated petrous apices. Disc levels: C2-3: No spinal canal or neural foraminal stenosis. C3-4: Small posterior disc protrusion without significant spinal canal stenosis. Uncovertebral and facet degenerative changes resulting moderate bilateral neural foraminal neck. C4-5: Prominent posterior disc herniation resulting in severe spinal canal stenosis with cord compression. 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 C5-6: No spinal canal or neural foraminal stenosis. C6-7: Small posterior disc protrusion. No significant spinal canal or neural foraminal stenosis. C7-T1: No spinal canal or neural foraminal stenosis. MRI THORACIC SPINE FINDINGS Alignment:  Physiologic. Vertebrae: No fracture, evidence of discitis, or bone lesion. Cord:  Normal signal and morphology. Paraspinal and other soft tissues: Negative. Disc levels: Small posterior disc protrusions at T3-4, T4-5 and T6-7. No significant spinal canal or neural foraminal stenosis at any thoracic level. MRI LUMBAR SPINE FINDINGS Segmentation:  Standard. Alignment:  Physiologic. Vertebrae:  No fracture, evidence of discitis, or bone  lesion. Conus medullaris and cauda equina: Conus extends to the T12 level. Conus and cauda equina appear normal. Paraspinal and other soft tissues: Negative. Disc levels: No significant disc bulge or herniation, spinal canal or  neural foraminal stenosis at any level. IMPRESSION: 1. Prominent posterior disc herniation at C4-5 resulting in severe spinal canal stenosis with cord compression. 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 2. No significant degenerative changes of the thoracic or lumbar spine. These results will be called to the ordering clinician or representative by the Radiologist Assistant, and communication documented in the PACS or [employer]. Electronically Signed   By: Baldemar Lenis M.D.   On: 08/13/2020 18:10   DG C-Arm 1-60 Min  Result Date: 09/07/2020 CLINICAL DATA:  C4-C5 ACDF EXAM: CERVICAL SPINE - 2-3 VIEW; DG C-ARM 1-60 MIN COMPARISON:  09/02/2020 FLUOROSCOPY TIME:  0 minutes 12 seconds Dose: 0.82 mGy Images: 3 FINDINGS: First image at 1330 hours demonstrates metallic instrument via anterior approach projecting anterior to C4-C5 disc space. A linear metallic body projects over the C4-C5 disc space. Subsequent image demonstrates placement of anterior plate and screws with intervening bone plug at C4-C5. No fracture or subluxation. IMPRESSION: Anterior fusion C4-C5. Electronically Signed   By: Ulyses Southward M.D.   On: 09/07/2020 15:27   ECHOCARDIOGRAM COMPLETE  Result Date: 08/25/2020    ECHOCARDIOGRAM REPORT   Patient Name:   JAICEON COLLISTER Kovalcik Date of Exam: 08/25/2020 Medical Rec #:  [id number]       Height:       72.0 in Accession #:    [id number]      Weight:       162.0 lb Date of Birth:  Aug 01, 1986       BSA:          1.948 m Patient Age:    34 years        BP:           124/78 mmHg Patient Gender: M               HR:           147 bpm. Exam Location:  Outpatient Procedure: 2D Echo Indications:    stroke I163.9   History:        Patient has no prior history of Echocardiogram examinations.  Sonographer:    Thurman Coyer RDCS (AE) Referring Phys: [id number] PATRICK E WRIGHT IMPRESSIONS  1. Left ventricular ejection fraction, by estimation, is 50 to 55%. The left ventricle has low normal function. The left ventricle has no regional wall motion abnormalities. Left ventricular diastolic function could not be evaluated.  2. Right ventricular systolic function is normal. The right ventricular size is normal. There is normal pulmonary artery systolic pressure. The estimated right ventricular systolic pressure is 16.8 mmHg.  3. The mitral valve is normal in structure. Mild mitral valve regurgitation. No evidence of mitral stenosis.  4. The aortic valve has an indeterminant number of cusps. Aortic valve regurgitation is not visualized. Mild aortic valve sclerosis is present, with no evidence of aortic valve stenosis.  5. The inferior vena cava is normal in size with greater than 50% respiratory variability, suggesting right atrial pressure of 3 mmHg. FINDINGS  Left Ventricle: Left ventricular ejection fraction, by estimation, is 50 to 55%. The left ventricle has low normal function. The left ventricle has no regional wall motion abnormalities. The left ventricular internal cavity size was normal in size. There is no left ventricular hypertrophy. Left ventricular diastolic function could not be evaluated due to atrial fibrillation. Left ventricular diastolic function could not be evaluated. Right Ventricle: The right ventricular size is normal. No increase in  right ventricular wall thickness. Right ventricular systolic function is normal. There is normal pulmonary artery systolic pressure. The tricuspid regurgitant velocity is 1.86 m/s, and  with an assumed right atrial pressure of 3 mmHg, the estimated right ventricular systolic pressure is 16.8 mmHg. Left Atrium: Left atrial size was normal in size. Right Atrium: Right atrial size was  normal in size. Pericardium: There is no evidence of pericardial effusion. Mitral Valve: The mitral valve is normal in structure. Mild mitral valve regurgitation. No evidence of mitral valve stenosis. Tricuspid Valve: The tricuspid valve is normal in structure. Tricuspid valve regurgitation is mild . No evidence of tricuspid stenosis. Aortic Valve: The aortic valve has an indeterminant number of cusps. Aortic valve regurgitation is not visualized. Mild aortic valve sclerosis is present, with no evidence of aortic valve stenosis. Pulmonic Valve: The pulmonic valve was normal in structure. Pulmonic valve regurgitation is not visualized. No evidence of pulmonic stenosis. Aorta: The aortic root is normal in size and structure. Venous: The inferior vena cava is normal in size with greater than 50% respiratory variability, suggesting right atrial pressure of 3 mmHg. IAS/Shunts: No atrial level shunt detected by color flow Doppler.  LEFT VENTRICLE PLAX 2D LVIDd:         4.30 cm  Diastology LVIDs:         3.30 cm  LV e' medial:   11.70 cm/s LV PW:         0.90 cm  LV E/e' medial: 5.1 LV IVS:        1.00 cm LVOT diam:     2.00 cm LV SV:         36 LV SV Index:   18 LVOT Area:     3.14 cm  RIGHT VENTRICLE RV S prime:     8.81 cm/s LEFT ATRIUM           Index       RIGHT ATRIUM           Index LA diam:      2.50 cm 1.28 cm/m  RA Area:     11.60 cm LA Vol (A2C): 52.8 ml 27.10 ml/m RA Volume:   26.30 ml  13.50 ml/m LA Vol (A4C): 35.5 ml 18.22 ml/m  AORTIC VALVE LVOT Vmax:   92.43 cm/s LVOT Vmean:  61.867 cm/s LVOT VTI:    0.114 m  AORTA Ao Root diam: 2.00 cm MITRAL VALVE               TRICUSPID VALVE MV Area (PHT): 7.74 cm    TR Peak grad:   13.8 mmHg MV Decel Time: 98 msec     TR Vmax:        186.00 cm/s MV E velocity: 60.20 cm/s MV A velocity: 78.80 cm/s  SHUNTS MV E/A ratio:  0.76        Systemic VTI:  0.11 m                            Systemic Diam: 2.00 cm Armanda Magic MD Electronically signed by Armanda Magic MD  Signature Date/Time: 08/25/2020/5:43:49 PM    Final    XR Cervical Spine 2 or 3 views  Result Date: 09/03/2020 2 view cervical spine x-rays demonstrate normal curvature normal disc space no acute findings no soft tissue calcification. Impression: Normal cervical spine radiographs.   Discharge Instructions    Incentive spirometry RT   Complete by: As directed  Follow-up Information    Naida Sleight, PA-C Follow up in 1 week(s).   Specialties: Physician Assistant, Orthopedic Surgery Contact information: [address] [place] [address] [phone]               Discharge Plan:  discharge to home  Disposition:     Signed: Zonia Kief for mark yates MD 09/11/2020, 3:07 PM

## 2020-09-14 ENCOUNTER — Telehealth: Payer: Self-pay | Admitting: Critical Care Medicine

## 2020-09-14 NOTE — Telephone Encounter (Signed)
Called patient and LVM letting him know that Dr. Delford Field would like for him to be seen at the end of the month. If patient calls back please transfer to office so that he can be scheduled.

## 2020-09-16 ENCOUNTER — Ambulatory Visit (INDEPENDENT_AMBULATORY_CARE_PROVIDER_SITE_OTHER): Payer: Self-pay | Admitting: Surgery

## 2020-09-16 ENCOUNTER — Ambulatory Visit: Payer: Self-pay

## 2020-09-16 ENCOUNTER — Encounter: Payer: Self-pay | Admitting: Surgery

## 2020-09-16 VITALS — BP 142/107 | HR 71 | Ht 72.0 in | Wt 157.0 lb

## 2020-09-16 DIAGNOSIS — Z981 Arthrodesis status: Secondary | ICD-10-CM

## 2020-09-16 NOTE — Progress Notes (Signed)
Post-Op Visit Note   Patient: Jon Nolan           Date of Birth: Oct 27, 1985           MRN: [id number] Visit Date: 09/16/2020 PCP: Storm Frisk, MD   Assessment & Plan:  Chief Complaint:  Chief Complaint  Patient presents with  . Neck - Routine Post Op    09/07/2020 C4-5 ACDF  Patient returns.  States that preop arm pain numbness and tingling is improved.  He states that he also feels like he has better control of use in his hands playing video games.  He states that his legs are feeling better with ambulation.   Visit Diagnoses:  1. Status post cervical spinal fusion   C4-5 ACDF for myelopathy  Plan: Patient doing well.  I encouraged him to continue using his walker until he sees Dr. Ophelia Charter in 3 weeks for recheck.  I am having him come back a little sooner than our normal 6-week postop appointment.  Dr. Ophelia Charter can decide at that visit as to whether or not he can discontinue the walker or if formal PT is needed.  Patient will return sooner if needed.  All questions answered.  Must continue wearing cervical collar.  Follow-Up Instructions: Return in about 3 weeks (around 10/07/2020) for with dr yates for recheck cervical fusion.   Orders:  Orders Placed This Encounter  Procedures  . XR Cervical Spine 2 or 3 views   No orders of the defined types were placed in this encounter.   Imaging: No results found.  PMFS History: Patient Active Problem List   Diagnosis Date Noted  . Cervical spinal stenosis 09/07/2020  . Protrusion of cervical intervertebral disc 09/03/2020  . Stenosis of cervical spine with myelopathy (HCC) 09/03/2020  . Cervical radiculopathy at C5 08/20/2020  . Chronic pain syndrome 08/20/2020  . Cervical disc disease with myelopathy 08/20/2020  . History of cocaine use 07/06/2020  . Primary hypertension 07/06/2020  . Tobacco use 07/06/2020   Past Medical History:  Diagnosis Date  . Asthma   . Atrial fibrillation (HCC)   . COVID-19 08/25/2020  .  Hypertension   . Pre-diabetes     Family History  Problem Relation Age of Onset  . Diabetes Mother   . Hypertension Mother   . Hypertension Father   . Cancer Father   . Colon cancer Father   . Lung cancer Father     Past Surgical History:  Procedure Laterality Date  . ANTERIOR CERVICAL DECOMP/DISCECTOMY FUSION N/A 09/07/2020   Procedure: CERVICAL FOUR-FIVE ANTERIOR CERVICAL DECOMPRESSION/DISCECTOMY FUSION, ALLOGRAFT PLATE;  Surgeon: Eldred Manges, MD;  Location: [facility] OR;  Service: Orthopedics;  Laterality: N/A;  . NO PAST SURGERIES     Social History   Occupational History  . Occupation: cook at [employer]  . Smoking status: Current Every Day Smoker    Packs/day: 0.25    Years: 13.00    Pack years: 3.25    Types: Cigarettes  . Smokeless tobacco: Never Used  Vaping Use  . Vaping Use: Former  Substance and Sexual Activity  . Alcohol use: Yes    Alcohol/week: 3.0 standard drinks    Types: 3 Cans of beer per week    Comment: 3 week  . Drug use: Yes    Types: Marijuana, Cocaine    Comment: no cocaine since 06/2020  . Sexual activity: Not Currently    Exam: Wound looks good.  New steris applied.  No drainage or signs of infection.

## 2020-09-19 ENCOUNTER — Other Ambulatory Visit: Payer: Self-pay

## 2020-09-19 ENCOUNTER — Other Ambulatory Visit: Payer: Self-pay | Admitting: Critical Care Medicine

## 2020-09-19 MED ORDER — OXYCODONE-ACETAMINOPHEN 5-325 MG PO TABS: 1.0000 | ORAL_TABLET | Freq: Four times a day (QID) | ORAL | 0 refills | Status: DC | PRN

## 2020-09-21 ENCOUNTER — Ambulatory Visit: Payer: Self-pay | Admitting: Critical Care Medicine

## 2020-09-21 ENCOUNTER — Other Ambulatory Visit: Payer: Self-pay | Admitting: Orthopaedic Surgery

## 2020-09-25 ENCOUNTER — Other Ambulatory Visit: Payer: Self-pay | Admitting: Critical Care Medicine

## 2020-09-25 ENCOUNTER — Telehealth: Payer: Self-pay | Admitting: Critical Care Medicine

## 2020-09-25 ENCOUNTER — Other Ambulatory Visit: Payer: Self-pay | Admitting: Family Medicine

## 2020-09-25 MED ORDER — DILTIAZEM HCL ER COATED BEADS 240 MG PO CP24: 240.0000 mg | ORAL_CAPSULE | Freq: Every day | ORAL | 1 refills | Status: DC

## 2020-09-25 MED ORDER — LOSARTAN POTASSIUM 25 MG PO TABS: 25.0000 mg | ORAL_TABLET | Freq: Every day | ORAL | 3 refills | Status: DC

## 2020-09-25 NOTE — Telephone Encounter (Signed)
Pt called stating that he is completely out of his losartan and diltiazem. He states that he only has 3 more pills of his hydrocodone. Please advise.

## 2020-09-25 NOTE — Telephone Encounter (Signed)
Copied from CRM [id number]. Topic: Quick Communication - Rx Refill/Question >> Sep 25, 2020  1:12 PM Izora Ribas, Everette A wrote: Medication: oxyCODONE-acetaminophen (PERCOCET/ROXICET) 5-325 MG  diltiazem (CARDIZEM CD) 240 MG  losartan (COZAAR) 25 MG   Has the patient contacted their pharmacy? No. Patient was uncertain of which pharmacy they would be using. Patient was encouraged by agent to contact pharmacy for future refills.   Preferred Pharmacy (with phone number or street name): [id number] - [place], [place] - [address]  Phone:  [phone]  Agent: Please be advised that RX refills may take up to 3 business days. We ask that you follow-up with your pharmacy.

## 2020-09-25 NOTE — Telephone Encounter (Signed)
Patient called and stated his refill for oxycodone was not at his pharmacy. Dr. Delford Field sent the refill on 2/12 and patient said when he called they did not have anything for him. Please advise.

## 2020-09-25 NOTE — Telephone Encounter (Signed)
Spoke to patient Verified name and DOB Reminded patient of the agreement he made with Dr. Delford Field on the pain contract, that quantity of 40 should last 10 days, however the dose was for Oxycodone 10/325.  He picked up Rx on 09/19/20 for Oxycodone 5/325 mg.  Patient states he has 2 left and is hurting pretty bad, requesting a refill.   Pls.advise.

## 2020-09-25 NOTE — Telephone Encounter (Signed)
Requested medication (s) are due for refill today: na  Requested medication (s) are on the active medication list: yes  Last refill:  oxycodone 09/19/20 #50 0 refills , cardizem 09/04/20 #90 1 refill , cozaar 09/04/20-12/03/20  #90 3 refills  Future visit scheduled: yes in 5 days  Notes to clinic:  oxycodone- not delegated per protocol, cardizem and cozaar prescribed by different provider. Do you want to refill Rx's?   Requested Prescriptions  Pending Prescriptions Disp Refills   diltiazem (CARDIZEM CD) 240 MG 24 hr capsule 90 capsule 1    Sig: Take 1 capsule (240 mg total) by mouth daily.      Cardiovascular:  Calcium Channel Blockers Failed - 09/25/2020  1:20 PM      Failed - Last BP in normal range    BP Readings from Last 1 Encounters:  09/16/20 (!) 142/107          Passed - Valid encounter within last 6 months    Recent Outpatient Visits           1 month ago Cervical disc disease with myelopathy   [facility] Storm Frisk, MD   2 months ago Primary hypertension   [facility] Storm Frisk, MD   2 months ago Cerebrovascular accident (CVA), unspecified mechanism [facility])   [facility] Storm Frisk, MD       Future Appointments             In 5 days Storm Frisk, MD [facility]   In 1 week Eldred Manges, MD [facility]   In 1 month O'Neal, Ronnald Ramp, MD [facility]               losartan (COZAAR) 25 MG tablet 90 tablet 3    Sig: Take 1 tablet (25 mg total) by mouth daily.      Cardiovascular:  Angiotensin Receptor Blockers Failed - 09/25/2020  1:20 PM      Failed - Last BP in normal range    BP Readings from Last 1 Encounters:  09/16/20 (!) 142/107          Passed - Cr in normal range and within 180 days    Creatinine  Date Value Ref Range Status  08/20/2020 157.8 20.0 - 300.0 mg/dL  Final   Creatinine, Ser  Date Value Ref Range Status  09/07/2020 1.18 0.61 - 1.24 mg/dL Final          Passed - K in normal range and within 180 days    Potassium  Date Value Ref Range Status  09/07/2020 3.6 3.5 - 5.1 mmol/L Final          Passed - Patient is not pregnant      Passed - Valid encounter within last 6 months    Recent Outpatient Visits           1 month ago Cervical disc disease with myelopathy   [facility] Storm Frisk, MD   2 months ago Primary hypertension   [facility] Storm Frisk, MD   2 months ago Cerebrovascular accident (CVA), unspecified mechanism [facility])   [facility] Storm Frisk, MD       Future Appointments             In 5  days Storm Frisk, MD [facility]   In 1 week Eldred Manges, MD [facility]   In 1 month O'Neal, Ronnald Ramp, MD [facility]               oxyCODONE-acetaminophen (PERCOCET/ROXICET) 5-325 MG tablet 50 tablet 0    Sig: Take 1-2 tablets by mouth every 6 (six) hours as needed for severe pain.      Not Delegated - Analgesics:  Opioid Agonist Combinations Failed - 09/25/2020  1:20 PM      Failed - This refill cannot be delegated      Passed - Urine Drug Screen completed in last 360 days      Passed - Valid encounter within last 6 months    Recent Outpatient Visits           1 month ago Cervical disc disease with myelopathy   [facility] Storm Frisk, MD   2 months ago Primary hypertension   [facility] Storm Frisk, MD   2 months ago Cerebrovascular accident (CVA), unspecified mechanism [facility])   [facility] Storm Frisk, MD       Future Appointments             In 5 days Storm Frisk, MD [facility]   In 1 week Eldred Manges, MD [facility]   In 1 month O'Neal, Ronnald Ramp, MD [facility] [place], [facility]

## 2020-09-27 ENCOUNTER — Other Ambulatory Visit: Payer: Self-pay | Admitting: Critical Care Medicine

## 2020-09-27 MED ORDER — OXYCODONE-ACETAMINOPHEN 5-325 MG PO TABS
1.0000 | ORAL_TABLET | Freq: Four times a day (QID) | ORAL | 0 refills | Status: DC | PRN
Start: 2020-09-27 — End: 2020-11-23

## 2020-09-27 NOTE — Progress Notes (Signed)
Discussed with pt   Pain is in lower back  Will renew Rx one more time  Also has blood in stool, will assess at 2/23 OV

## 2020-09-27 NOTE — Telephone Encounter (Signed)
See other note. Rx refilled

## 2020-09-30 ENCOUNTER — Encounter: Payer: Self-pay | Admitting: Critical Care Medicine

## 2020-09-30 ENCOUNTER — Ambulatory Visit: Payer: Self-pay | Attending: Critical Care Medicine | Admitting: Critical Care Medicine

## 2020-09-30 ENCOUNTER — Other Ambulatory Visit: Payer: Self-pay

## 2020-09-30 VITALS — BP 124/86 | HR 77 | Resp 16 | Wt 163.0 lb

## 2020-09-30 DIAGNOSIS — Z720 Tobacco use: Secondary | ICD-10-CM

## 2020-09-30 DIAGNOSIS — I10 Essential (primary) hypertension: Secondary | ICD-10-CM

## 2020-09-30 DIAGNOSIS — M5020 Other cervical disc displacement, unspecified cervical region: Secondary | ICD-10-CM

## 2020-09-30 DIAGNOSIS — G894 Chronic pain syndrome: Secondary | ICD-10-CM

## 2020-09-30 DIAGNOSIS — K625 Hemorrhage of anus and rectum: Secondary | ICD-10-CM

## 2020-09-30 DIAGNOSIS — G992 Myelopathy in diseases classified elsewhere: Secondary | ICD-10-CM

## 2020-09-30 DIAGNOSIS — M5000 Cervical disc disorder with myelopathy, unspecified cervical region: Secondary | ICD-10-CM

## 2020-09-30 DIAGNOSIS — M4802 Spinal stenosis, cervical region: Secondary | ICD-10-CM

## 2020-09-30 HISTORY — DX: Hemorrhage of anus and rectum: K62.5

## 2020-09-30 MED ORDER — METHOCARBAMOL 500 MG PO TABS: 500.0000 mg | ORAL_TABLET | Freq: Four times a day (QID) | ORAL | 1 refills | Status: DC | PRN

## 2020-09-30 NOTE — Assessment & Plan Note (Addendum)
I checked [facility] drug database and there are no multiple prescribers  Patient given 1 additional dose of 5 mg Percocet with a 50 count given on 20 February without plans for additional refills  Urine drug screen checked again today

## 2020-09-30 NOTE — Progress Notes (Signed)
Patient ID: Jon Nolan, male    DOB: 12-Nov-1985, 35 y.o.   MRN: [id number]    07/06/2020  35 y.o.M here to est PCP  This patient gives a history of developing on November 21 right upper extremity and right upper leg weakness and pain after he was pushing a car that was stuck in the mud.  Prior to this he had had onset over 2 months ago some right-sided weakness and tremors in both the right upper and lower extremities.  Notes he is an every other day cocaine user and also marijuana user.  On arrival today blood pressure was 163/116.  Previously had been to urgent care in the emergency room and the concern there was muscle spasticity causing new onset of pain and they felt the patient was neurologically intact so no neuro imaging was performed.  His neurologic presentation was somewhat vague at that visit on November 22.  He was given a prescription for cyclobenzaprine and Naprosyn neither which have improved his symptom complex.  No prior known history of diagnosed hypertension however the patient had elevated blood pressure readings in the past  On arrival blood sugar is 114 A1c was 5.9  08/20/2020 Neuro w/u:  Severe C spine disease  This patient is seen in return follow-up and was initially assessed in November thought to have acute stroke MRI of the brain was negative he continued to have weakness in the right arm and leg and some pain as well.  This patient underwent went further evaluation after neurology was consulted and underwent MRI of the spine found to have severe C-spine disease at C4-C5 with spinal cord impingement  He returns today after course of prednisone and is also on opiate with relief of pain  The patient states he is receiving Medicaid assistance on arrival blood pressure still elevated 153/91 but he shows a blood pressure meter from home with numbers in the 125/80 range and he is compliant with his blood pressure medicine  Patient states he is no longer  using cocaine he is still smoking some cigarettes  09/30/2020 This patient is seen in return follow-up and since the last visit underwent anterior cervical discectomy with spine fusion of the cervical spine per Dr. Ophelia Charter.  The patient does have a chronic pain management contract here and previous drug screen was negative except for the opiate he was on he is obtaining a urine drug screen again today.  In the interim since I last saw him in after his surgery he was in the ER with atrial fibrillation and is since been to see cardiology.  His echocardiogram was unremarkable.  He does have hypertension.  He is no longer using cocaine.  Patient also complains of some rectal bleeding that recently occurred.  He states he still having right lower extremity pain and lower back pain but overall is improving after his neck surgery.  I did give him 1 more refill of a 40 count 5 mg/325 Percocet but he is largely using Tylenol now at this time.  I indicated to him that this will be the last opiate prescription I will be writing for him that he should be weaning off this since he is now postoperative and doing better  Patient has no other complaints at this time other than the rectal bleeding.  On arrival blood pressure is 139/96 however at home she is in he is in the 124/86 range The patient maintains the losartan 25 mg daily and diltiazem  240 mg daily from cardiology no longer is on the amlodipine or valsartan HCT    Past Medical History:  Diagnosis Date  . Asthma   . Atrial fibrillation (HCC)   . COVID-19 08/25/2020  . Hypertension   . Pre-diabetes   . Protrusion of cervical intervertebral disc 09/03/2020     Family History  Problem Relation Age of Onset  . Diabetes Mother   . Hypertension Mother   . Hypertension Father   . Cancer Father   . Colon cancer Father   . Lung cancer Father      Social History   Socioeconomic History  . Marital status: Single    Spouse name: Not on file  . Number of  children: 3  . Years of education: Not on file  . Highest education level: Not on file  Occupational History  . Occupation: cook at [employer]  . Smoking status: Current Every Day Smoker    Packs/day: 0.25    Years: 13.00    Pack years: 3.25    Types: Cigarettes  . Smokeless tobacco: Never Used  Vaping Use  . Vaping Use: Former  Substance and Sexual Activity  . Alcohol use: Yes    Alcohol/week: 3.0 standard drinks    Types: 3 Cans of beer per week    Comment: 3 week  . Drug use: Yes    Types: Marijuana, Cocaine    Comment: no cocaine since 06/2020  . Sexual activity: Not Currently  Other Topics Concern  . Not on file  Social History Narrative   Right handed   Occasional prn caffeine   One story home   Social Determinants of Health   Financial Resource Strain: Not on file  Food Insecurity: Not on file  Transportation Needs: Not on file  Physical Activity: Not on file  Stress: Not on file  Social Connections: Not on file  Intimate Partner Violence: Not on file     No Known Allergies   Outpatient Medications Prior to Visit  Medication Sig Dispense Refill  . diltiazem (CARDIZEM CD) 240 MG 24 hr capsule Take 1 capsule (240 mg total) by mouth daily. 90 capsule 1  . losartan (COZAAR) 25 MG tablet Take 1 tablet (25 mg total) by mouth daily. 90 tablet 3  . oxyCODONE-acetaminophen (PERCOCET/ROXICET) 5-325 MG tablet Take 1-2 tablets by mouth every 6 (six) hours as needed for severe pain. 50 tablet 0  . BLACK ELDERBERRY PO Take 1 each by mouth daily. With Vit E and Zinc (Patient not taking: Reported on 09/30/2020)     No facility-administered medications prior to visit.      Review of Systems  Constitutional: Negative for fatigue.  HENT: Negative.   Respiratory: Negative.   Cardiovascular: Negative.   Gastrointestinal: Negative.   Genitourinary: Negative.   Musculoskeletal: Positive for back pain, gait problem and neck pain.  Neurological: Positive for  numbness. Negative for dizziness, tremors, seizures, facial asymmetry, weakness, light-headedness and headaches.  Psychiatric/Behavioral: Negative for agitation, behavioral problems, confusion, decreased concentration, self-injury, sleep disturbance and suicidal ideas. The patient is not nervous/anxious.        Objective:   Physical Exam   Vitals:   09/30/20 0837 09/30/20 0839  BP: (!) 139/96 124/86  Pulse: 77   Resp: 16   SpO2: 99%   Weight: 163 lb (73.9 kg)     Gen: Pleasant, well-nourished, in no distress,  normal affect  ENT: No lesions,  mouth clear,  oropharynx clear, no postnasal  drip  Neck: No JVD, no TMG, no carotid bruits  Lungs: No use of accessory muscles, no dullness to percussion, clear without rales or rhonchi  Cardiovascular: RRR, heart sounds normal, no murmur or gallops, no peripheral edema  Abdomen: soft and NT, no HSM,  BS normal  Musculoskeletal: No deformities, no cyanosis or clubbing  Neuro: Patient is weak in the right upper extremity and right lower extremity however this is improved from previous exams Skin: Warm, no lesions or rashes  Stool heme positive   MRI C/T/L spine per dr Everlena Cooper:  IMPRESSION: 1. Prominent posterior disc herniation at C4-5 resulting in severe spinal canal stenosis with cord compression. 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 2. No significant degenerative changes of the thoracic or lumbar spine.     Assessment & Plan:  I personally reviewed all images and lab data in the [facility] system as well as any outside material available during this office visit and agree with the  radiology impressions.   Primary hypertension Hypertension well controlled on diltiazem and low-dose losartan from cardiology  No changes in medications  Rectal bleed Trace blood on rectal exam today with probable internal hemorrhoids Referral to Dr. Jimmye Norman in our clinic for evaluation  of rectal bleeding  Cervical disc disease with myelopathy Cervical disc with myelopathy status post anterior cervical spine surgery per Dr. Ophelia Charter improving postoperatively  Plan to wean off opiates and use Tylenol alone for pain control  Plan to refer to physical therapy to see if we can get him off the walker  Chronic pain syndrome I checked [facility] drug database and there are no multiple prescribers  Patient given 1 additional dose of 5 mg Percocet with a 50 count given on 20 February without plans for additional refills  Urine drug screen checked again today  Tobacco use    Current smoking consumption amount: 5 cigarettes a day . Dicsussion on advise to quit smoking and smoking impacts: Cardiovascular impacts  . Patient's willingness to quit: Might be willing to cut back on smoking  . Methods to quit smoking discussed: Behavioral modification nicotine replacement  . Medication management of smoking session drugs discussed: No medications as of yet to we can work-up the patient's neuro status  . Resources provided:  AVS   . Setting quit date not yet established  . Follow-up arranged 64month   Time spent counseling the patient: 1 month    Diagnoses and all orders for this visit:  Cervical spinal stenosis -     Ambulatory referral to Physical Therapy  Chronic pain syndrome -     [id number]+Oxyco+Alc+Crt-[id number]  Protrusion of cervical intervertebral disc -     Ambulatory referral to Physical Therapy  Stenosis of cervical spine with myelopathy (HCC) -     Ambulatory referral to Physical Therapy  Cervical disc disease with myelopathy  Tobacco use  Primary hypertension  Rectal bleed  Other orders -     methocarbamol (ROBAXIN) 500 MG tablet; Take 1 tablet (500 mg total) by mouth every 6 (six) hours as needed for muscle spasms.

## 2020-09-30 NOTE — Assessment & Plan Note (Signed)
    Current smoking consumption amount: 5 cigarettes a day . Dicsussion on advise to quit smoking and smoking impacts: Cardiovascular impacts  . Patient's willingness to quit: Might be willing to cut back on smoking  . Methods to quit smoking discussed: Behavioral modification nicotine replacement  . Medication management of smoking session drugs discussed: No medications as of yet to we can work-up the patient's neuro status  . Resources provided:  AVS   . Setting quit date not yet established  . Follow-up arranged 1month   Time spent counseling the patient: 1 month  

## 2020-09-30 NOTE — Patient Instructions (Signed)
Please consider obtaining a COVID vaccine I suggested Pfizer you may receive that anytime below is a number he can call to find 1 COVID-19 Vaccine Information can be found at: [email or web address] For questions related to vaccine distribution or appointments, please email [email or web address] or call [phone].    No change in medications  Refills on Robaxin methocarbamol given for muscle relaxant  Urine sample for drug screen is the only lab we obtained today  I am going to have you see Dr. Jimmye Norman our colorectal surgeon who volunteers in our clinic to evaluate your rectal bleeding I suspect you may have internal hemorrhoids  A referral to physical therapy will be made  Keep your appointments with orthopedics  Return to see Dr. Delford Field in 2 months  Please continue to focus on smoking cessation   Tobacco Use Disorder Tobacco use disorder (TUD) occurs when a person craves, seeks, and uses tobacco, regardless of the consequences. This disorder can cause problems with mental and physical health. It can affect your ability to have healthy relationships, and it can keep you from meeting your responsibilities at work, home, or school. Tobacco may be:  Smoked as a cigarette or cigar.  Inhaled using e-cigarettes.  Smoked in a pipe or hookah.  Chewed as smokeless tobacco.  Inhaled into the nostrils as snuff. Tobacco products contain a dangerous chemical called nicotine, which is very addictive. Nicotine triggers hormones that make the body feel stimulated and works on areas of the brain that make you feel good. These effects can make it hard for people to quit nicotine. Tobacco contains many other unsafe chemicals that can damage almost every organ in the body. Smoking tobacco also puts others in danger due to fire risk and possible health problems caused by breathing in secondhand smoke. What are the signs or  symptoms? Symptoms of TUD may include:  Being unable to slow down or stop your tobacco use.  Spending an abnormal amount of time getting or using tobacco.  Craving tobacco. Cravings may last for up to 6 months after quitting.  Tobacco use that: ? Interferes with your work, school, or home life. ? Interferes with your personal and social relationships. ? Makes you give up activities that you once enjoyed or found important.  Using tobacco even though you know that it is: ? Dangerous or bad for your health or someone else's health. ? Causing problems in your life.  Needing more and more of the substance to get the same effect (developing tolerance).  Experiencing unpleasant symptoms if you do not use the substance (withdrawal). Withdrawal symptoms may include: ? Depressed, anxious, or irritable mood. ? Difficulty concentrating. ? Increased appetite. ? Restlessness or trouble sleeping.  Using the substance to avoid withdrawal. How is this diagnosed? This condition may be diagnosed based on:  Your current and past tobacco use. Your health care provider may ask questions about how your tobacco use affects your life.  A physical exam. You may be diagnosed with TUD if you have at least two symptoms within a 6-month period. How is this treated? This condition is treated by stopping tobacco use. Many people are unable to quit on their own and need help. Treatment may include:  Nicotine replacement therapy (NRT). NRT provides nicotine without the other harmful chemicals in tobacco. NRT gradually lowers the dosage of nicotine in the body and reduces withdrawal symptoms. NRT is available as: ? Over-the-counter gums, lozenges, and skin patches. ? Prescription mouth inhalers and nasal sprays.  Medicine that  acts on the brain to reduce cravings and withdrawal symptoms.  A type of talk therapy that examines your triggers for tobacco use, how to avoid them, and how to cope with cravings  (behavioral therapy).  Hypnosis. This may help with withdrawal symptoms.  Joining a support group for others coping with TUD. The best treatment for TUD is usually a combination of medicine, talk therapy, and support groups. Recovery can be a long process. Many people start using tobacco again after stopping (relapse). If you relapse, it does not mean that treatment will not work. Follow these instructions at home: Lifestyle  Do not use any products that contain nicotine or tobacco, such as cigarettes and e-cigarettes.  Avoid things that trigger tobacco use as much as you can. Triggers include people and situations that usually cause you to use tobacco.  Avoid drinks that contain caffeine, including coffee. These may worsen some withdrawal symptoms.  Find ways to manage stress. Wanting to smoke may cause stress, and stress can make you want to smoke. Relaxation techniques such as deep breathing, meditation, and yoga may help.  Attend support groups as needed. These groups are an important part of long-term recovery for many people. General instructions  Take over-the-counter and prescription medicines only as told by your health care provider.  Check with your health care provider before taking any new prescription or over-the-counter medicines.  Decide on a friend, family member, or smoking quit-line (such as 1-800-QUIT-NOW in the [place] that you can call or text when you feel the urge to smoke or when you need help coping with cravings.  Keep all follow-up visits as told by your health care provider and therapist. This is important.   Contact a health care provider if:  You are not able to take your medicines as prescribed.  Your symptoms get worse, even with treatment. Summary  Tobacco use disorder (TUD) occurs when a person craves, seeks, and uses tobacco regardless of the consequences.  This condition may be diagnosed based on your current and past tobacco use and a physical  exam.  Many people are unable to quit on their own and need help. Recovery can be a long process.  The most effective treatment for TUD is usually a combination of medicine, talk therapy, and support groups. This information is not intended to replace advice given to you by your health care provider. Make sure you discuss any questions you have with your health care provider. Document Revised: 07/12/2017 Document Reviewed: 07/12/2017 Elsevier Patient Education  2021 [employer].

## 2020-09-30 NOTE — Assessment & Plan Note (Signed)
Trace blood on rectal exam today with probable internal hemorrhoids Referral to Dr. Jimmye Norman in our clinic for evaluation of rectal bleeding

## 2020-09-30 NOTE — Progress Notes (Signed)
Hospital f/u  - blood per rectum

## 2020-09-30 NOTE — Assessment & Plan Note (Signed)
Cervical disc with myelopathy status post anterior cervical spine surgery per Dr. Ophelia Charter improving postoperatively  Plan to wean off opiates and use Tylenol alone for pain control  Plan to refer to physical therapy to see if we can get him off the walker

## 2020-09-30 NOTE — Assessment & Plan Note (Signed)
Hypertension well controlled on diltiazem and low-dose losartan from cardiology  No changes in medications

## 2020-10-02 ENCOUNTER — Other Ambulatory Visit: Payer: Self-pay

## 2020-10-02 ENCOUNTER — Ambulatory Visit: Payer: Self-pay | Attending: Internal Medicine

## 2020-10-02 DIAGNOSIS — Z23 Encounter for immunization: Secondary | ICD-10-CM

## 2020-10-02 NOTE — Progress Notes (Signed)
   Covid-19 Vaccination Clinic  Name:  Jon Nolan    MRN: [id number] DOB: 02-Aug-1986  10/02/2020  Jon Nolan was observed post Covid-19 immunization for 15 minutes without incident. He was provided with Vaccine Information Sheet and instruction to access the V-Safe system.   Jon Nolan was instructed to call 911 with any severe reactions post vaccine: Marland Kitchen Difficulty breathing  . Swelling of face and throat  . A fast heartbeat  . A bad rash all over body  . Dizziness and weakness   Immunizations Administered    Name Date Dose VIS Date Route   Moderna COVID-19 Vaccine 10/02/2020  1:12 PM 0.5 mL 05/27/2020 Intramuscular   Manufacturer: Moderna   Lot: [id number]   NDC: 84720-721-82

## 2020-10-07 ENCOUNTER — Ambulatory Visit (INDEPENDENT_AMBULATORY_CARE_PROVIDER_SITE_OTHER): Payer: Self-pay | Admitting: Orthopaedic Surgery

## 2020-10-07 ENCOUNTER — Other Ambulatory Visit: Payer: Self-pay

## 2020-10-07 ENCOUNTER — Telehealth: Payer: Self-pay | Admitting: Critical Care Medicine

## 2020-10-07 ENCOUNTER — Encounter: Payer: Self-pay | Admitting: Orthopaedic Surgery

## 2020-10-07 DIAGNOSIS — Z981 Arthrodesis status: Secondary | ICD-10-CM | POA: Insufficient documentation

## 2020-10-07 NOTE — Progress Notes (Signed)
   Post-Op Visit Note   Patient: Jon Nolan           Date of Birth: 1986-07-03           MRN: [id number] Visit Date: 10/07/2020 PCP: Storm Frisk, MD   Assessment & Plan: Follow-up C4-5 single level fusion.  He is at 5 weeks.  He is using a walker but noted for the first time he can walk a few steps does not use the walker and has been able to stand up to urinate for the first time since back in 2021 before he started having neck problems.  Patient had some cord myelomalacia.  He notices improvement in strength in his arms still has tingling and pain in his shoulders and some in his arms.  He is noticing improvement in strength of his legs and wide-based gait is been improved.  Return 1 week for lateral flexion-extension C-spine x-ray.  Incision is well-healed.  Chief Complaint:  Chief Complaint  Patient presents with  . Neck - Follow-up    09/07/2020 C4-5 ACDF   Visit Diagnoses:  1. Status post cervical spinal fusion     Plan: ROV one week for flex/ext xrays.   Follow-Up Instructions: Return in about 1 week (around 10/14/2020).   Orders:  No orders of the defined types were placed in this encounter.  No orders of the defined types were placed in this encounter.   Imaging: No results found.  PMFS History: Patient Active Problem List   Diagnosis Date Noted  . Status post cervical spinal fusion 10/07/2020  . Rectal bleed 09/30/2020  . Cervical spinal stenosis 09/07/2020  . Stenosis of cervical spine with myelopathy (HCC) 09/03/2020  . Cervical radiculopathy at C5 08/20/2020  . Chronic pain syndrome 08/20/2020  . Cervical disc disease with myelopathy 08/20/2020  . History of cocaine use 07/06/2020  . Primary hypertension 07/06/2020  . Tobacco use 07/06/2020   Past Medical History:  Diagnosis Date  . Asthma   . Atrial fibrillation (HCC)   . COVID-19 08/25/2020  . Hypertension   . Pre-diabetes   . Protrusion of cervical intervertebral disc 09/03/2020     Family History  Problem Relation Age of Onset  . Diabetes Mother   . Hypertension Mother   . Hypertension Father   . Cancer Father   . Colon cancer Father   . Lung cancer Father     Past Surgical History:  Procedure Laterality Date  . ANTERIOR CERVICAL DECOMP/DISCECTOMY FUSION N/A 09/07/2020   Procedure: CERVICAL FOUR-FIVE ANTERIOR CERVICAL DECOMPRESSION/DISCECTOMY FUSION, ALLOGRAFT PLATE;  Surgeon: Eldred Manges, MD;  Location: [facility] OR;  Service: Orthopedics;  Laterality: N/A;  . NO PAST SURGERIES     Social History   Occupational History  . Occupation: cook at [employer]  . Smoking status: Current Every Day Smoker    Packs/day: 0.25    Years: 13.00    Pack years: 3.25    Types: Cigarettes  . Smokeless tobacco: Never Used  Vaping Use  . Vaping Use: Former  Substance and Sexual Activity  . Alcohol use: Yes    Alcohol/week: 3.0 standard drinks    Types: 3 Cans of beer per week    Comment: 3 week  . Drug use: Yes    Types: Marijuana, Cocaine    Comment: no cocaine since 06/2020  . Sexual activity: Not Currently

## 2020-10-07 NOTE — Telephone Encounter (Signed)
Copied from CRM [id number]. Topic: General - Other >> Oct 07, 2020 11:58 AM Jaquita Rector A wrote: Reason for CRM: Patient called in asking to speak to Dr Delford Field say that he just had a follow up with surgeon Dr Ophelia Charter and need to speak with Dr Delford Field. Please call Ph# [phone]

## 2020-10-12 NOTE — Telephone Encounter (Signed)
Noted great excellent progress

## 2020-10-12 NOTE — Telephone Encounter (Signed)
Patient name and DOB verified. He wanted to update Dr. Delford Field about visit with Dr. Ophelia Charter.  Was taken off pain medication and now using Tylenol Not using walker any longer, is now using a cane Has to f/u on 3/11 for XR and test.

## 2020-10-13 LAB — OXYCODONE/OXYMORPHONE, CONFIRM
OXYCODONE/OXYMORPH: POSITIVE — AB
OXYCODONE: 628 ng/mL
OXYCODONE: POSITIVE — AB
OXYMORPHONE: NEGATIVE

## 2020-10-13 LAB — DRUG SCREEN 764883 11+OXYCO+ALC+CRT-BUND
Amphetamines, Urine: NEGATIVE ng/mL
BENZODIAZ UR QL: NEGATIVE ng/mL
Barbiturate: NEGATIVE ng/mL
Cocaine (Metabolite): NEGATIVE ng/mL
Creatinine: 129.8 mg/dL (ref 20.0–300.0)
Ethanol: NEGATIVE %
Meperidine: NEGATIVE ng/mL
Methadone Screen, Urine: NEGATIVE ng/mL
OPIATE SCREEN URINE: NEGATIVE ng/mL
Phencyclidine: NEGATIVE ng/mL
Propoxyphene: NEGATIVE ng/mL
Tramadol: NEGATIVE ng/mL
pH, Urine: 5.4 (ref 4.5–8.9)

## 2020-10-13 LAB — CANNABINOID CONFIRMATION, UR
CANNABINOIDS: POSITIVE — AB
Carboxy THC GC/MS Conf: 122 ng/mL

## 2020-10-16 ENCOUNTER — Ambulatory Visit (INDEPENDENT_AMBULATORY_CARE_PROVIDER_SITE_OTHER): Payer: Self-pay

## 2020-10-16 ENCOUNTER — Ambulatory Visit (INDEPENDENT_AMBULATORY_CARE_PROVIDER_SITE_OTHER): Payer: Self-pay | Admitting: Orthopaedic Surgery

## 2020-10-16 VITALS — BP 151/91 | HR 73 | Ht 72.0 in | Wt 163.0 lb

## 2020-10-16 DIAGNOSIS — Z981 Arthrodesis status: Secondary | ICD-10-CM

## 2020-10-16 NOTE — Progress Notes (Signed)
   Post-Op Visit Note   Patient: Jon Nolan           Date of Birth: 1986/07/22           MRN: [id number] Visit Date: 10/16/2020 PCP: Storm Frisk, MD   Assessment & Plan: Post single level cervical fusion for severe stenosis.  Patient had myelopathy preoperative was having significant problems walking.  His gait is significantly improved.  Patient is noted he is able play video games which he could not do before the surgery.  He states his hands feel normal to him.  Chief Complaint:  Chief Complaint  Patient presents with  . Neck - Routine Post Op   Visit Diagnoses:  1. Status post cervical spinal fusion     Plan: Patient given note for release to work limited start next week on 10/20/2020.  He can return to see me on an as-needed basis.  Patient staffed with the surgical result.  Follow-Up Instructions: No follow-ups on file.   Orders:  Orders Placed This Encounter  Procedures  . XR Cervical Spine 2 or 3 views   No orders of the defined types were placed in this encounter.   Imaging: XR Cervical Spine 2 or 3 views  Result Date: 10/16/2020 AP and lateral flexion-extension cervical spine x-rays are obtained and reviewed.  This shows single level cervical fusion C4-5 without motion on flexion-extension Impression: C4-5 single level fusion without motion at 6 weeks postop.   PMFS History: Patient Active Problem List   Diagnosis Date Noted  . Status post cervical spinal fusion 10/07/2020  . Rectal bleed 09/30/2020  . Cervical spinal stenosis 09/07/2020  . Stenosis of cervical spine with myelopathy (HCC) 09/03/2020  . Cervical radiculopathy at C5 08/20/2020  . Chronic pain syndrome 08/20/2020  . Cervical disc disease with myelopathy 08/20/2020  . History of cocaine use 07/06/2020  . Primary hypertension 07/06/2020  . Tobacco use 07/06/2020   Past Medical History:  Diagnosis Date  . Asthma   . Atrial fibrillation (HCC)   . COVID-19 08/25/2020  .  Hypertension   . Pre-diabetes   . Protrusion of cervical intervertebral disc 09/03/2020    Family History  Problem Relation Age of Onset  . Diabetes Mother   . Hypertension Mother   . Hypertension Father   . Cancer Father   . Colon cancer Father   . Lung cancer Father     Past Surgical History:  Procedure Laterality Date  . ANTERIOR CERVICAL DECOMP/DISCECTOMY FUSION N/A 09/07/2020   Procedure: CERVICAL FOUR-FIVE ANTERIOR CERVICAL DECOMPRESSION/DISCECTOMY FUSION, ALLOGRAFT PLATE;  Surgeon: Eldred Manges, MD;  Location: [facility] OR;  Service: Orthopedics;  Laterality: N/A;  . NO PAST SURGERIES     Social History   Occupational History  . Occupation: cook at [employer]  . Smoking status: Current Every Day Smoker    Packs/day: 0.25    Years: 13.00    Pack years: 3.25    Types: Cigarettes  . Smokeless tobacco: Never Used  Vaping Use  . Vaping Use: Former  Substance and Sexual Activity  . Alcohol use: Yes    Alcohol/week: 3.0 standard drinks    Types: 3 Cans of beer per week    Comment: 3 week  . Drug use: Yes    Types: Marijuana, Cocaine    Comment: no cocaine since 06/2020  . Sexual activity: Not Currently

## 2020-10-20 ENCOUNTER — Ambulatory Visit: Payer: Self-pay | Admitting: Critical Care Medicine

## 2020-10-29 ENCOUNTER — Ambulatory Visit (HOSPITAL_COMMUNITY): Admission: EM | Admit: 2020-10-29 | Discharge: 2020-10-29 | Discharge status: Against Medical Advice | Payer: Self-pay

## 2020-10-29 ENCOUNTER — Emergency Department (HOSPITAL_COMMUNITY): Payer: Self-pay

## 2020-10-29 ENCOUNTER — Emergency Department (HOSPITAL_COMMUNITY)
Admission: EM | Admit: 2020-10-29 | Discharge: 2020-10-30 | Disposition: A | Discharge status: Home / Self Care | Payer: Self-pay | Attending: Emergency Medicine | Admitting: Emergency Medicine

## 2020-10-29 ENCOUNTER — Encounter (HOSPITAL_COMMUNITY): Payer: Self-pay | Admitting: Emergency Medicine

## 2020-10-29 ENCOUNTER — Other Ambulatory Visit: Payer: Self-pay

## 2020-10-29 ENCOUNTER — Ambulatory Visit: Payer: Self-pay | Admitting: General Surgery

## 2020-10-29 DIAGNOSIS — Z5321 Procedure and treatment not carried out due to patient leaving prior to being seen by health care provider: Secondary | ICD-10-CM | POA: Insufficient documentation

## 2020-10-29 DIAGNOSIS — R079 Chest pain, unspecified: Secondary | ICD-10-CM | POA: Insufficient documentation

## 2020-10-29 LAB — CBC
HCT: 42.3 % (ref 39.0–52.0)
Hemoglobin: 13.8 g/dL (ref 13.0–17.0)
MCH: 29.5 pg (ref 26.0–34.0)
MCHC: 32.6 g/dL (ref 30.0–36.0)
MCV: 90.4 fL (ref 80.0–100.0)
Platelets: 223 10*3/uL (ref 150–400)
RBC: 4.68 MIL/uL (ref 4.22–5.81)
RDW: 14.7 % (ref 11.5–15.5)
WBC: 7.4 10*3/uL (ref 4.0–10.5)
nRBC: 0 % (ref 0.0–0.2)

## 2020-10-29 LAB — BASIC METABOLIC PANEL
Anion gap: 8 (ref 5–15)
BUN: 7 mg/dL (ref 6–20)
CO2: 26 mmol/L (ref 22–32)
Calcium: 9.2 mg/dL (ref 8.9–10.3)
Chloride: 107 mmol/L (ref 98–111)
Creatinine, Ser: 1.13 mg/dL (ref 0.61–1.24)
GFR, Estimated: >60 mL/min (ref >60)
Glucose, Bld: 94 mg/dL (ref 70–99)
Potassium: 3.6 mmol/L (ref 3.5–5.1)
Sodium: 141 mmol/L (ref 135–145)

## 2020-10-29 LAB — TROPONIN I (HIGH SENSITIVITY): Troponin I (High Sensitivity): 7 ng/L (ref <18)

## 2020-10-29 NOTE — ED Triage Notes (Signed)
Patient complains of left sided chest pain and feeling like he is in afib starting yesterday. Patient states he ran out of diltiazem and has been under increased stress due to a court case. Patient alert, oriented, and in no apparent distress at this time.

## 2020-10-29 NOTE — ED Notes (Signed)
Pt LWBS. Pt stated he was feeling better. Pt was advised and encouraged to stay but pt denied.

## 2020-10-30 ENCOUNTER — Other Ambulatory Visit (HOSPITAL_COMMUNITY): Payer: Self-pay | Admitting: Internal Medicine

## 2020-10-30 ENCOUNTER — Ambulatory Visit: Payer: Self-pay | Attending: Internal Medicine

## 2020-10-30 ENCOUNTER — Ambulatory Visit: Payer: Self-pay

## 2020-10-30 DIAGNOSIS — Z23 Encounter for immunization: Secondary | ICD-10-CM

## 2020-10-30 NOTE — Progress Notes (Signed)
° °  Covid-19 Vaccination Clinic  Name:  Jon Nolan    MRN: [id number] DOB: 27-Jun-1986  10/30/2020  Mr. Jon Nolan was observed post Covid-19 immunization for 15 minutes without incident. He was provided with Vaccine Information Sheet and instruction to access the V-Safe system.   Mr. Jon Nolan was instructed to call 911 with any severe reactions post vaccine:  Difficulty breathing   Swelling of face and throat   A fast heartbeat   A bad rash all over body   Dizziness and weakness   Immunizations Administered    Name Date Dose VIS Date Route   Moderna COVID-19 Vaccine 10/30/2020  1:47 PM 0.5 mL 05/27/2020 Intramuscular   Manufacturer: Moderna   Lot: [id number]   NDC: 92330-076-22

## 2020-11-01 NOTE — Progress Notes (Signed)
Cardiology Office Note:   Date:  11/04/2020  NAME:  Jon Nolan    MRN: [id number] DOB:  January 12, 1986   PCP:  Storm Frisk, MD  Cardiologist:  No primary care provider on file.  Electrophysiologist:  None   Referring MD: Storm Frisk, MD   Chief Complaint  Patient presents with  . Atrial Fibrillation    History of Present Illness:   Jon Nolan is a 35 y.o. male with a hx of HTN, paroxysmal Aifb, tobacco abuse who presents for follow-up. Seen in January for atrial fibrillation.  This was in the setting of cord impingement from C4-5 spinal compression.  He underwent spinal fusion surgery.  Repeat EKG showed he is back in sinus rhythm.  He presents for follow-up today. EKG today shows he is normal sinus rhythm.  He may have some palpitations over.  He is doing well since his surgery.  He is still smoking 2 to 3 cigarettes/day.  He was advised to quit this.  He does report some dizziness and lightheadedness when taking diltiazem and losartan.  We will stop his diltiazem and switch back to amlodipine.  He did well on this before.  We discussed that his A. fib was likely driven by pain from spinal cord compression.  He is doing well since surgery.  Pain is improved.  He has no neurological deficits.  He denies any significant chest pain or shortness of breath in the office today.  Problem List 1. HTN 2. Atrial fibrillation -dx 08/25/2020 -CHADSVASC=1 3. 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 Tobacco abuse   Past Medical History: Past Medical History:  Diagnosis Date  . Asthma   . Atrial fibrillation (HCC)   . COVID-19 08/25/2020  . Hypertension   . Pre-diabetes   . Protrusion of cervical intervertebral disc 09/03/2020    Past Surgical History: Past Surgical History:  Procedure Laterality Date  . ANTERIOR CERVICAL DECOMP/DISCECTOMY FUSION N/A 09/07/2020   Procedure: CERVICAL FOUR-FIVE ANTERIOR CERVICAL  DECOMPRESSION/DISCECTOMY FUSION, ALLOGRAFT PLATE;  Surgeon: Eldred Manges, MD;  Location: [facility] OR;  Service: Orthopedics;  Laterality: N/A;  . NO PAST SURGERIES      Current Medications: Current Meds  Medication Sig  . BLACK ELDERBERRY PO Take 1 each by mouth daily. With Vit E and Zinc  . methocarbamol (ROBAXIN) 500 MG tablet Take 1 tablet (500 mg total) by mouth every 6 (six) hours as needed for muscle spasms.  Marland Kitchen oxyCODONE-acetaminophen (PERCOCET/ROXICET) 5-325 MG tablet Take 1-2 tablets by mouth every 6 (six) hours as needed for severe pain.  . [DISCONTINUED] diltiazem (CARDIZEM CD) 240 MG 24 hr capsule Take 1 capsule (240 mg total) by mouth daily.  . [DISCONTINUED] losartan (COZAAR) 25 MG tablet Take 1 tablet (25 mg total) by mouth daily.     Allergies:    Patient has no known allergies.   Social History: Social History   Socioeconomic History  . Marital status: Single    Spouse name: Not on file  . Number of children: 3  . Years of education: Not on file  . Highest education level: Not on file  Occupational History  . Occupation: cook at [employer]  . Smoking status: Current Every Day Smoker    Packs/day: 0.25    Years: 13.00    Pack years: 3.25    Types: Cigarettes  . Smokeless tobacco: Never Used  Vaping Use  . Vaping Use: Former  Substance and  Sexual Activity  . Alcohol use: Yes    Alcohol/week: 3.0 standard drinks    Types: 3 Cans of beer per week    Comment: 3 week  . Drug use: Yes    Types: Marijuana, Cocaine    Comment: no cocaine since 06/2020  . Sexual activity: Not Currently  Other Topics Concern  . Not on file  Social History Narrative   Right handed   Occasional prn caffeine   One story home   Social Determinants of Health   Financial Resource Strain: Not on file  Food Insecurity: Not on file  Transportation Needs: Not on file  Physical Activity: Not on file  Stress: Not on file  Social Connections: Not on file     Family  History: The patient's family history includes Cancer in his father; Colon cancer in his father; Diabetes in his mother; Hypertension in his father and mother; Lung cancer in his father.  ROS:   All other ROS reviewed and negative. Pertinent positives noted in the HPI.     EKGs/Labs/Other Studies Reviewed:   The following studies were personally reviewed by me today:  EKG:  EKG is  ordered today.  The ekg ordered today demonstrates sinus rhythm heart rate 66, old anteroseptal infarct likely related to hypertension, and was personally reviewed by me.   TTE 08/25/2020 1. Left ventricular ejection fraction, by estimation, is 50 to 55%. The  left ventricle has low normal function. The left ventricle has no regional  wall motion abnormalities. Left ventricular diastolic function could not  be evaluated.  2. Right ventricular systolic function is normal. The right ventricular  size is normal. There is normal pulmonary artery systolic pressure. The  estimated right ventricular systolic pressure is 16.8 mmHg.  3. The mitral valve is normal in structure. Mild mitral valve  regurgitation. No evidence of mitral stenosis.  4. The aortic valve has an indeterminant number of cusps. Aortic valve  regurgitation is not visualized. Mild aortic valve sclerosis is present,  with no evidence of aortic valve stenosis.  5. The inferior vena cava is normal in size with greater than 50%  respiratory variability, suggesting right atrial pressure of 3 mmHg.   Recent Labs: 07/06/2020: TSH 1.240 08/25/2020: Magnesium 2.3 09/07/2020: ALT 16 10/29/2020: BUN 7; Creatinine, Ser 1.13; Hemoglobin 13.8; Platelets 223; Potassium 3.6; Sodium 141   Recent Lipid Panel    Component Value Date/Time   CHOL 171 07/06/2020 1719   TRIG 93 07/06/2020 1719   HDL 63 07/06/2020 1719   CHOLHDL 2.7 07/06/2020 1719   LDLCALC 91 07/06/2020 1719    Physical Exam:   VS:  BP (!) 156/98   Pulse 66   Ht 6' (1.829 m)   Wt 160  lb 9.6 oz (72.8 kg)   SpO2 100%   BMI 21.78 kg/m    Wt Readings from Last 3 Encounters:  11/04/20 160 lb 9.6 oz (72.8 kg)  10/16/20 163 lb (73.9 kg)  10/07/20 163 lb (73.9 kg)    General: Well nourished, well developed, in no acute distress Head: Atraumatic, normal size  Eyes: PEERLA, EOMI  Neck: Supple, no JVD Endocrine: No thryomegaly Cardiac: Normal S1, S2; RRR; no murmurs, rubs, or gallops Lungs: Clear to auscultation bilaterally, no wheezing, rhonchi or rales  Abd: Soft, nontender, no hepatomegaly  Ext: No edema, pulses 2+ Musculoskeletal: No deformities, BUE and BLE strength normal and equal Skin: Warm and dry, no rashes   Neuro: Alert and oriented to person, place, time,  and situation, CNII-XII grossly intact, no focal deficits  Psych: Normal mood and affect   ASSESSMENT:   Jon Nolan is a 35 y.o. male who presents for the following: 1. Paroxysmal atrial fibrillation (HCC)   2. Primary hypertension   3. Tobacco abuse     PLAN:   1. Paroxysmal atrial fibrillation (HCC) -CHADSVASC=1. -He developed A. fib in the setting of significant pain from spinal cord compression.  He is converted spontaneously back to normal sinus rhythm.  He had normal thyroid study.  Normal echocardiogram.  I suspect this was all driven by pain.  I have also advised him to remain active and to quit smoking.  We did discussion this is important.  We will work for better blood pressure control.  He overall seems to be doing well since surgery. -No need for long-term anticoagulation given low risk of stroke.  He will see me yearly.  2. Primary hypertension -BP elevated today.  Out of diltiazem.  We will stop diltiazem and put him back on Norvasc 10 mg daily.  Continue losartan 25 mg daily.  3. Tobacco abuse -Smoking cessation counseling provided.  Disposition: Return in about 1 year (around 11/04/2021).  Medication Adjustments/Labs and Tests Ordered: Current medicines are reviewed at  length with the patient today.  Concerns regarding medicines are outlined above.  Orders Placed This Encounter  Procedures  . EKG 12-Lead   Meds ordered this encounter  Medications  . amLODipine (NORVASC) 10 MG tablet    Sig: Take 1 tablet (10 mg total) by mouth daily.    Dispense:  90 tablet    Refill:  3  . losartan (COZAAR) 25 MG tablet    Sig: Take 1 tablet (25 mg total) by mouth daily.    Dispense:  90 tablet    Refill:  3    Patient Instructions  Medication Instructions:  Stop Diltiazem  Start Amlodipine 10 mg daily  The current medical regimen is effective;  continue present plan and medications.  *If you need a refill on your cardiac medications before your next appointment, please call your pharmacy*   Follow-Up: At [facility], you and your health needs are our priority.  As part of our continuing mission to provide you with exceptional heart care, we have created designated Provider Care Teams.  These Care Teams include your primary Cardiologist (physician) and Advanced Practice Providers (APPs -  Physician Assistants and Nurse Practitioners) who all work together to provide you with the care you need, when you need it.  We recommend signing up for the patient portal called "MyChart".  Sign up information is provided on this After Visit Summary.  MyChart is used to connect with patients for Virtual Visits (Telemedicine).  Patients are able to view lab/test results, encounter notes, upcoming appointments, etc.  Non-urgent messages can be sent to your provider as well.   To learn more about what you can do with MyChart, go to [email or web address].    Your next appointment:   12 month(s)  The format for your next appointment:   In Person  Provider:   Lennie Odor, MD   Other Instructions Kardia mobile device     Time Spent with Patient: I have spent a total of 25 minutes with patient reviewing hospital notes, telemetry, EKGs, labs and examining the  patient as well as establishing an assessment and plan that was discussed with the patient.  > 50% of time was spent in direct patient care.  Signed, Gerri Spore  Cleophus Molt, MD, [facility]  [address] [place], [place] [address] [phone]  11/04/2020 1:29 PM

## 2020-11-03 ENCOUNTER — Ambulatory Visit: Payer: Self-pay | Admitting: Cardiovascular Disease

## 2020-11-03 MED FILL — MODERNA COVID-19 VACCINE 10: 100 | 28 days supply | Qty: 1 | Fill #0

## 2020-11-04 ENCOUNTER — Encounter: Payer: Self-pay | Admitting: Cardiovascular Disease

## 2020-11-04 ENCOUNTER — Ambulatory Visit (INDEPENDENT_AMBULATORY_CARE_PROVIDER_SITE_OTHER): Payer: Self-pay | Admitting: Cardiovascular Disease

## 2020-11-04 ENCOUNTER — Other Ambulatory Visit: Payer: Self-pay

## 2020-11-04 VITALS — BP 156/98 | HR 66 | Ht 72.0 in | Wt 160.6 lb

## 2020-11-04 DIAGNOSIS — I480 Paroxysmal atrial fibrillation: Secondary | ICD-10-CM

## 2020-11-04 DIAGNOSIS — I4819 Other persistent atrial fibrillation: Secondary | ICD-10-CM

## 2020-11-04 DIAGNOSIS — Z720 Tobacco use: Secondary | ICD-10-CM

## 2020-11-04 DIAGNOSIS — I10 Essential (primary) hypertension: Secondary | ICD-10-CM

## 2020-11-04 MED ORDER — LOSARTAN POTASSIUM 25 MG PO TABS: 25.0000 mg | ORAL_TABLET | Freq: Every day | ORAL | 3 refills | Status: DC

## 2020-11-04 MED ORDER — AMLODIPINE BESYLATE 10 MG PO TABS: 10.0000 mg | ORAL_TABLET | Freq: Every day | ORAL | 3 refills | Status: DC

## 2020-11-04 NOTE — Patient Instructions (Signed)
Medication Instructions:  Stop Diltiazem  Start Amlodipine 10 mg daily  The current medical regimen is effective;  continue present plan and medications.  *If you need a refill on your cardiac medications before your next appointment, please call your pharmacy*   Follow-Up: At [facility], you and your health needs are our priority.  As part of our continuing mission to provide you with exceptional heart care, we have created designated Provider Care Teams.  These Care Teams include your primary Cardiologist (physician) and Advanced Practice Providers (APPs -  Physician Assistants and Nurse Practitioners) who all work together to provide you with the care you need, when you need it.  We recommend signing up for the patient portal called "MyChart".  Sign up information is provided on this After Visit Summary.  MyChart is used to connect with patients for Virtual Visits (Telemedicine).  Patients are able to view lab/test results, encounter notes, upcoming appointments, etc.  Non-urgent messages can be sent to your provider as well.   To learn more about what you can do with MyChart, go to [email or web address].    Your next appointment:   12 month(s)  The format for your next appointment:   In Person  Provider:   Lennie Odor, MD   Other Instructions Kardia mobile device

## 2020-11-06 ENCOUNTER — Telehealth: Payer: Self-pay

## 2020-11-06 ENCOUNTER — Telehealth: Payer: Self-pay | Admitting: Critical Care Medicine

## 2020-11-06 MED ORDER — AMLODIPINE BESYLATE 10 MG PO TABS: 10.0000 mg | ORAL_TABLET | Freq: Every day | ORAL | 3 refills | Status: DC

## 2020-11-06 MED ORDER — LOSARTAN POTASSIUM 25 MG PO TABS
25.0000 mg | ORAL_TABLET | Freq: Every day | ORAL | 3 refills | Status: DC
Start: 2020-11-06 — End: 2020-11-23

## 2020-11-06 NOTE — Telephone Encounter (Signed)
Pt will need to contact heart doctor for medication that were sent over today.

## 2020-11-06 NOTE — Telephone Encounter (Signed)
Called patient, advised I could resend medications to a different pharmacy.  Advised patient of [facility] as well to try for his medications, but to let me know if he had issues today.   Patient verbalized understanding.

## 2020-11-06 NOTE — Telephone Encounter (Signed)
Copied from CRM [id number]. Topic: General - Inquiry >> Nov 06, 2020  2:38 PM Daphine Deutscher D wrote:  Pt called saying his blood pressure medication Losartin and Amlodipine is too expensive. He wants to know if there is a less expensive resource he can use for medications.  CB#  [phone]

## 2020-11-06 NOTE — Telephone Encounter (Signed)
Pt was called and informed that he will be billed.

## 2020-11-06 NOTE — Telephone Encounter (Signed)
Copied from CRM [id number]. Topic: General - Other >> Nov 05, 2020 10:35 AM Pawlus, Maxine Glenn A wrote: Reason for CRM: Pt wanted to speak with Dr Delford Field or her his nurse to update them about his cardiologist appt. Pt also wanted to know if he could still come to his appt even though he does not have the $75. Please advise.

## 2020-11-17 ENCOUNTER — Ambulatory Visit: Payer: Self-pay | Attending: General Surgery | Admitting: General Surgery

## 2020-11-17 ENCOUNTER — Other Ambulatory Visit: Payer: Self-pay

## 2020-11-17 ENCOUNTER — Encounter: Payer: Self-pay | Admitting: General Surgery

## 2020-11-17 VITALS — BP 147/91 | HR 92 | Resp 20 | Ht 72.0 in | Wt 155.6 lb

## 2020-11-17 DIAGNOSIS — K640 First degree hemorrhoids: Secondary | ICD-10-CM

## 2020-11-17 DIAGNOSIS — K602 Anal fissure, unspecified: Secondary | ICD-10-CM

## 2020-11-17 MED ORDER — HYDROCORTISONE ACETATE 25 MG RE SUPP: 25.0000 mg | Freq: Two times a day (BID) | RECTAL | 0 refills | Status: DC

## 2020-11-17 NOTE — Patient Instructions (Signed)
Use suppositories until finished.

## 2020-11-17 NOTE — Progress Notes (Signed)
New Patient Office Visit  Subjective:  Patient ID: Jon Nolan, male    DOB: Apr 11, 1986  Age: 35 y.o. MRN: [id number]  CC:  Chief Complaint  Patient presents with  . Hemorrhoids    HPI Jon Nolan presents for evaluation of hemorrhoids.  Starting about a month ago had blood on tissue after wiping.  No blood in the toilet.  Has not had any for about 1.5 weeks.  This happened previously and he had a fissure.  Currently has no pain.    Regula daily bowel movements.  No constipation or diarrhea.  Past Medical History:  Diagnosis Date  . Asthma   . Atrial fibrillation (HCC)   . COVID-19 08/25/2020  . Hypertension   . Pre-diabetes   . Protrusion of cervical intervertebral disc 09/03/2020    Past Surgical History:  Procedure Laterality Date  . ANTERIOR CERVICAL DECOMP/DISCECTOMY FUSION N/A 09/07/2020   Procedure: CERVICAL FOUR-FIVE ANTERIOR CERVICAL DECOMPRESSION/DISCECTOMY FUSION, ALLOGRAFT PLATE;  Surgeon: Eldred Manges, MD;  Location: [facility] OR;  Service: Orthopedics;  Laterality: N/A;  . NO PAST SURGERIES      Family History  Problem Relation Age of Onset  . Diabetes Mother   . Hypertension Mother   . Hypertension Father   . Cancer Father   . Colon cancer Father   . Lung cancer Father     Social History   Socioeconomic History  . Marital status: Single    Spouse name: Not on file  . Number of children: 3  . Years of education: Not on file  . Highest education level: Not on file  Occupational History  . Occupation: cook at [employer]  . Smoking status: Current Every Day Smoker    Packs/day: 0.25    Years: 13.00    Pack years: 3.25    Types: Cigarettes  . Smokeless tobacco: Never Used  Vaping Use  . Vaping Use: Former  Substance and Sexual Activity  . Alcohol use: Yes    Alcohol/week: 3.0 standard drinks    Types: 3 Cans of beer per week    Comment: 3 week  . Drug use: Yes    Types: Marijuana, Cocaine    Comment: no cocaine  since 06/2020  . Sexual activity: Not Currently  Other Topics Concern  . Not on file  Social History Narrative   Right handed   Occasional prn caffeine   One story home   Social Determinants of Health   Financial Resource Strain: Not on file  Food Insecurity: Not on file  Transportation Needs: Not on file  Physical Activity: Not on file  Stress: Not on file  Social Connections: Not on file  Intimate Partner Violence: Not on file    ROS Review of Systems  Constitutional:       Occassional blood on tissue with wiping.  All other systems reviewed and are negative.   Objective:   Today's Vitals: BP (!) 147/91   Pulse 92   Resp 20   Ht 6' (1.829 m)   Wt 155 lb 9.6 oz (70.6 kg)   SpO2 96%   BMI 21.10 kg/m   Physical Exam Genitourinary:    Rectum: Tenderness (posteriorly with mild firmness.  no blood) present.       Assessment & Plan:   1. Possible healing posterior anal fissure 2. Grade I internal hemorrhoid.s  Fissure does not require specific treatment.  Will prescribe Anusol for hemorrhoids.  RTC 2 months for  recheck Problem List Items Addressed This Visit   None   Visit Diagnoses    Anal fissure    -  Primary   Grade I hemorrhoids          Outpatient Encounter Medications as of 11/17/2020  Medication Sig  . amLODipine (NORVASC) 10 MG tablet Take 1 tablet (10 mg total) by mouth daily.  Marland Kitchen BLACK ELDERBERRY PO Take 1 each by mouth daily. With Vit E and Zinc  . hydrocortisone (ANUSOL-HC) 25 MG suppository Place 1 suppository (25 mg total) rectally 2 (two) times daily.  Marland Kitchen losartan (COZAAR) 25 MG tablet Take 1 tablet (25 mg total) by mouth daily.  . methocarbamol (ROBAXIN) 500 MG tablet Take 1 tablet (500 mg total) by mouth every 6 (six) hours as needed for muscle spasms.  Marland Kitchen oxyCODONE-acetaminophen (PERCOCET/ROXICET) 5-325 MG tablet Take 1-2 tablets by mouth every 6 (six) hours as needed for severe pain. (Patient not taking: Reported on 11/17/2020)  .  [DISCONTINUED] metoprolol succinate (TOPROL-XL) 50 MG 24 hr tablet Take 1 tablet (50 mg total) by mouth daily. Take with or immediately following a meal. (Patient not taking: Reported on 08/20/2020)  . [DISCONTINUED] valsartan-hydrochlorothiazide (DIOVAN HCT) 80-12.5 MG tablet Take 1 tablet by mouth daily.   No facility-administered encounter medications on file as of 11/17/2020.    Follow-up: Return in about 2 months (around 01/17/2021) for Recheck of hemorrhoids.  May cancel if significantly improved.   Jimmye Norman, MD

## 2020-11-19 ENCOUNTER — Telehealth: Payer: Self-pay | Admitting: Critical Care Medicine

## 2020-11-19 NOTE — Telephone Encounter (Signed)
Pt states he will keep virtual appt. Pt states he can't afford the suppository Dr. Lindie Spruce prescribed him and is wanting to know if there is something else he can use.

## 2020-11-19 NOTE — Telephone Encounter (Signed)
Copied from CRM [id number]. Topic: General - Other >> Nov 19, 2020  9:30 AM Jaquita Rector A wrote: Reason for CRM: Patient called in wanting to speak to Dr Florene Route nurse in reference to his appointment that have been changed to virtual. Say that he need to be seen in office due to pain in his right side.  Please call Ph# [phone] or [phone]   Spoke with Pt and he will keep his virtual visit with Dr. Delford Field on 4/18 but would like to know if Dr. Delford Field can see him in person soon for the pain.   Also patient is asking if there is a cheaper brand for the hydrocortisone 25 MG suppository prescribed by Dr. Lindie Spruce. He can not afford what was sent to his pharmacy.
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
## 2020-11-21 NOTE — Telephone Encounter (Signed)
Let pt know unfortunately all suppositories and rectal cream come at this cost level

## 2020-11-23 ENCOUNTER — Ambulatory Visit: Payer: Self-pay | Attending: Critical Care Medicine | Admitting: Critical Care Medicine

## 2020-11-23 ENCOUNTER — Other Ambulatory Visit: Payer: Self-pay

## 2020-11-23 ENCOUNTER — Encounter: Payer: Self-pay | Admitting: Critical Care Medicine

## 2020-11-23 ENCOUNTER — Other Ambulatory Visit (HOSPITAL_COMMUNITY): Payer: Self-pay

## 2020-11-23 VITALS — BP 124/87

## 2020-11-23 DIAGNOSIS — I10 Essential (primary) hypertension: Secondary | ICD-10-CM

## 2020-11-23 DIAGNOSIS — K625 Hemorrhage of anus and rectum: Secondary | ICD-10-CM

## 2020-11-23 DIAGNOSIS — Z720 Tobacco use: Secondary | ICD-10-CM

## 2020-11-23 DIAGNOSIS — M5000 Cervical disc disorder with myelopathy, unspecified cervical region: Secondary | ICD-10-CM

## 2020-11-23 DIAGNOSIS — F17210 Nicotine dependence, cigarettes, uncomplicated: Secondary | ICD-10-CM

## 2020-11-23 MED ORDER — GABAPENTIN 300 MG PO CAPS
300.0000 mg | ORAL_CAPSULE | Freq: Three times a day (TID) | ORAL | 3 refills | Status: DC
  Filled 2020-11-23: qty 90, 30d supply, fill #0

## 2020-11-23 MED ORDER — HYDROCORTISONE ACETATE 25 MG RE SUPP
25.0000 mg | Freq: Two times a day (BID) | RECTAL | 0 refills | Status: DC
Start: 2020-11-23 — End: 2021-03-08
  Filled 2020-11-23: qty 12, 6d supply, fill #0

## 2020-11-23 MED ORDER — LOSARTAN POTASSIUM 25 MG PO TABS
25.0000 mg | ORAL_TABLET | Freq: Every day | ORAL | 0 refills | Status: DC
  Filled 2020-11-23: qty 30, 30d supply, fill #0
  Filled 2020-11-23: qty 90, 90d supply, fill #0

## 2020-11-23 MED ORDER — AMLODIPINE BESYLATE 10 MG PO TABS
10.0000 mg | ORAL_TABLET | Freq: Every day | ORAL | 3 refills | Status: DC
  Filled 2020-11-23: qty 30, 30d supply, fill #0

## 2020-11-23 MED ORDER — METHOCARBAMOL 500 MG PO TABS
500.0000 mg | ORAL_TABLET | Freq: Four times a day (QID) | ORAL | 1 refills | Status: DC | PRN
  Filled 2020-11-23: qty 90, 23d supply, fill #0

## 2020-11-23 NOTE — Assessment & Plan Note (Addendum)
    Current smoking consumption amount: 5 cigarettes a day . Dicsussion on advise to quit smoking and smoking impacts: Cardiovascular impacts  . Patient's willingness to quit: Might be willing to cut back on smoking  . Methods to quit smoking discussed: Behavioral modification nicotine replacement  . Medication management of smoking session drugs discussed: No medications as of yet to we can work-up the patient's neuro status  . Resources provided:  AVS   . Setting quit date not yet established  . Follow-up arranged 9month   Time spent counseling the patient: 5 min

## 2020-11-23 NOTE — Assessment & Plan Note (Signed)
Patient with an anal fissure per Dr. Lindie Spruce has received Anusol prescription and will fill this through the [facility] patient assistance fund

## 2020-11-23 NOTE — Patient Instructions (Signed)
Refills on all your medicines sent to [facility] outpatient pharmacy and we prescribed gabapentin 300 mg 3 times daily to assist with the nerve pain in your neck and leg  Continue to focus on smoking cessation as you are doing  Return visit will occur in May we will call you with that appointment  We sent all your medications to the [facility] outpatient pharmacy including the Anusol suppository and all of these medicines will be free on this 1 fill using a patient assistance fund

## 2020-11-23 NOTE — Assessment & Plan Note (Addendum)
History of cervical spine disease status postsurgery with fusion now with myelopathy which is slowly resolving  Patient to continue his home physical therapy, I indicated no opiates will be prescribed as this will not help this condition, I will refill the patient's methocarbamol.  Patient knows this is a fluid situation and will gradually slowly improve.  We will try gabapentin 300 mg 3 times daily to see if the myelopathic symptoms will be improved

## 2020-11-23 NOTE — Assessment & Plan Note (Signed)
Blood pressure currently under good control no change in current medications which will be the amlodipine 10 mg daily and losartan 25 mg daily

## 2020-11-25 NOTE — Telephone Encounter (Signed)
Pt doesn't have any questions or concerns Pt has already picked up rxs at outpatient pharmacy

## 2020-12-02 ENCOUNTER — Telehealth: Payer: Self-pay | Admitting: Critical Care Medicine

## 2020-12-02 NOTE — Telephone Encounter (Signed)
Called Pt to schedule follow up appt with Dr. Delford Field. Call was not able to be completed with number in chart.

## 2020-12-02 NOTE — Telephone Encounter (Signed)
-----   Message from Storm Frisk, MD sent at 11/23/2020  8:49 AM EDT ----- Needs in person OV with me end of May face to face

## 2020-12-24 ENCOUNTER — Other Ambulatory Visit (HOSPITAL_BASED_OUTPATIENT_CLINIC_OR_DEPARTMENT_OTHER): Payer: Self-pay

## 2020-12-24 ENCOUNTER — Other Ambulatory Visit (HOSPITAL_COMMUNITY): Payer: Self-pay

## 2020-12-31 ENCOUNTER — Emergency Department (HOSPITAL_COMMUNITY): Payer: No Typology Code available for payment source

## 2020-12-31 ENCOUNTER — Emergency Department (HOSPITAL_COMMUNITY)
Admission: EM | Admit: 2020-12-31 | Discharge: 2020-12-31 | Disposition: A | Discharge status: Home / Self Care | Payer: No Typology Code available for payment source | Attending: Emergency Medicine | Admitting: Emergency Medicine

## 2020-12-31 ENCOUNTER — Other Ambulatory Visit: Payer: Self-pay

## 2020-12-31 ENCOUNTER — Encounter (HOSPITAL_COMMUNITY): Payer: Self-pay | Admitting: *Deleted

## 2020-12-31 DIAGNOSIS — S161XXA Strain of muscle, fascia and tendon at neck level, initial encounter: Secondary | ICD-10-CM | POA: Diagnosis not present

## 2020-12-31 DIAGNOSIS — S199XXA Unspecified injury of neck, initial encounter: Secondary | ICD-10-CM | POA: Diagnosis present

## 2020-12-31 DIAGNOSIS — Z8616 Personal history of COVID-19: Secondary | ICD-10-CM | POA: Insufficient documentation

## 2020-12-31 DIAGNOSIS — Y92410 Unspecified street and highway as the place of occurrence of the external cause: Secondary | ICD-10-CM | POA: Insufficient documentation

## 2020-12-31 DIAGNOSIS — K029 Dental caries, unspecified: Secondary | ICD-10-CM | POA: Insufficient documentation

## 2020-12-31 DIAGNOSIS — S29012A Strain of muscle and tendon of back wall of thorax, initial encounter: Secondary | ICD-10-CM | POA: Insufficient documentation

## 2020-12-31 DIAGNOSIS — S39012A Strain of muscle, fascia and tendon of lower back, initial encounter: Secondary | ICD-10-CM

## 2020-12-31 DIAGNOSIS — J45909 Unspecified asthma, uncomplicated: Secondary | ICD-10-CM | POA: Diagnosis not present

## 2020-12-31 DIAGNOSIS — F17210 Nicotine dependence, cigarettes, uncomplicated: Secondary | ICD-10-CM | POA: Insufficient documentation

## 2020-12-31 DIAGNOSIS — Z79899 Other long term (current) drug therapy: Secondary | ICD-10-CM | POA: Diagnosis not present

## 2020-12-31 DIAGNOSIS — I10 Essential (primary) hypertension: Secondary | ICD-10-CM | POA: Insufficient documentation

## 2020-12-31 DIAGNOSIS — S060X0A Concussion without loss of consciousness, initial encounter: Secondary | ICD-10-CM | POA: Diagnosis not present

## 2020-12-31 DIAGNOSIS — S0083XA Contusion of other part of head, initial encounter: Secondary | ICD-10-CM | POA: Diagnosis not present

## 2020-12-31 MED ORDER — HYDROCODONE-ACETAMINOPHEN 5-325 MG PO TABS: 1.0000 | ORAL_TABLET | ORAL | 0 refills | Status: DC | PRN

## 2020-12-31 MED ORDER — HYDROCODONE-ACETAMINOPHEN 5-325 MG PO TABS
1.0000 | ORAL_TABLET | Freq: Once | ORAL | Status: AC
  Administered 2020-12-31: 1 via ORAL
  Filled 2020-12-31: qty 1

## 2020-12-31 MED ORDER — DIAZEPAM 5 MG PO TABS: 5.0000 mg | ORAL_TABLET | Freq: Two times a day (BID) | ORAL | 0 refills | Status: DC | PRN

## 2020-12-31 MED ORDER — KETOROLAC TROMETHAMINE 30 MG/ML IJ SOLN
30.0000 mg | Freq: Once | INTRAMUSCULAR | Status: AC
  Administered 2020-12-31: 30 mg via INTRAMUSCULAR
  Filled 2020-12-31: qty 1

## 2020-12-31 MED ORDER — DIAZEPAM 5 MG PO TABS
5.0000 mg | ORAL_TABLET | Freq: Once | ORAL | Status: AC
  Administered 2020-12-31: 5 mg via ORAL
  Filled 2020-12-31: qty 1

## 2020-12-31 MED ORDER — IBUPROFEN 600 MG PO TABS: 600.0000 mg | ORAL_TABLET | Freq: Four times a day (QID) | ORAL | 0 refills | Status: DC | PRN
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
## 2020-12-31 NOTE — ED Notes (Signed)
Received verbal report from [facility]. At this time

## 2020-12-31 NOTE — Telephone Encounter (Signed)
I was able to get in touch with him and got him scheduled first thing Wednesday morning.

## 2020-12-31 NOTE — Telephone Encounter (Signed)
Pt needs a workin visit next week with me Wed/Thur is best since tue is busy
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
## 2021-01-06 ENCOUNTER — Ambulatory Visit: Payer: Self-pay | Attending: Critical Care Medicine | Admitting: Critical Care Medicine

## 2021-01-06 ENCOUNTER — Other Ambulatory Visit: Payer: Self-pay

## 2021-01-06 ENCOUNTER — Encounter: Payer: Self-pay | Admitting: Critical Care Medicine

## 2021-01-06 DIAGNOSIS — Z720 Tobacco use: Secondary | ICD-10-CM

## 2021-01-06 DIAGNOSIS — S060X9A Concussion with loss of consciousness of unspecified duration, initial encounter: Secondary | ICD-10-CM

## 2021-01-06 DIAGNOSIS — S060X0A Concussion without loss of consciousness, initial encounter: Secondary | ICD-10-CM

## 2021-01-06 DIAGNOSIS — F17210 Nicotine dependence, cigarettes, uncomplicated: Secondary | ICD-10-CM

## 2021-01-06 DIAGNOSIS — I10 Essential (primary) hypertension: Secondary | ICD-10-CM

## 2021-01-06 DIAGNOSIS — S060XAA Concussion with loss of consciousness status unknown, initial encounter: Secondary | ICD-10-CM | POA: Insufficient documentation

## 2021-01-06 DIAGNOSIS — M546 Pain in thoracic spine: Secondary | ICD-10-CM

## 2021-01-06 HISTORY — DX: Concussion with loss of consciousness of unspecified duration, initial encounter: S06.0X9A

## 2021-01-06 HISTORY — DX: Pain in thoracic spine: M54.6

## 2021-01-06 HISTORY — DX: Concussion with loss of consciousness status unknown, initial encounter: S06.0XAA

## 2021-01-06 MED ORDER — LOSARTAN POTASSIUM 25 MG PO TABS
25.0000 mg | ORAL_TABLET | Freq: Every day | ORAL | 1 refills | Status: DC
  Filled 2021-01-06: qty 30, 30d supply, fill #0

## 2021-01-06 MED ORDER — AMLODIPINE BESYLATE 10 MG PO TABS
10.0000 mg | ORAL_TABLET | Freq: Every day | ORAL | 3 refills | Status: DC
  Filled 2021-01-06: qty 30, 30d supply, fill #0

## 2021-01-06 MED ORDER — CYCLOBENZAPRINE HCL 10 MG PO TABS
10.0000 mg | ORAL_TABLET | Freq: Three times a day (TID) | ORAL | 0 refills | Status: DC | PRN
  Filled 2021-01-06: qty 60, 20d supply, fill #0

## 2021-01-06 NOTE — Assessment & Plan Note (Signed)
Advised to reduce use of Norco prescription Continue to take ibuprofen for back pain as prescribed.  Take Flexeril as prescribed for back pain. Follow up in two months
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
## 2021-01-06 NOTE — Assessment & Plan Note (Signed)
Continue to take ibuprofen as prescribed for headaches. Follow up in two months

## 2021-01-06 NOTE — Patient Instructions (Signed)
Continue ibuprofen as needed for pain  I would minimize the Norco and Valium use  Continue your blood pressure medicines refills were sent to our pharmacy here  Begin Flexeril 3 times daily as needed for back pain  Follow some back exercises as outlined below  No additional imaging studies needed  Return Dr. Delford Field 2 months   Back Exercises These exercises help to make your trunk and back strong. They also help to keep the lower back flexible. Doing these exercises can help to prevent back pain or lessen existing pain.  If you have back pain, try to do these exercises 2-3 times each day or as told by your doctor.  As you get better, do the exercises once each day. Repeat the exercises more often as told by your doctor.  To stop back pain from coming back, do the exercises once each day, or as told by your doctor. Exercises Single knee to chest Do these steps 3-5 times in a row for each leg: 1. Lie on your back on a firm bed or the floor with your legs stretched out. 2. Bring one knee to your chest. 3. Grab your knee or thigh with both hands and hold them it in place. 4. Pull on your knee until you feel a gentle stretch in your lower back or buttocks. 5. Keep doing the stretch for 10-30 seconds. 6. Slowly let go of your leg and straighten it. Pelvic tilt Do these steps 5-10 times in a row: 1. Lie on your back on a firm bed or the floor with your legs stretched out. 2. Bend your knees so they point up to the ceiling. Your feet should be flat on the floor. 3. Tighten your lower belly (abdomen) muscles to press your lower back against the floor. This will make your tailbone point up to the ceiling instead of pointing down to your feet or the floor. 4. Stay in this position for 5-10 seconds while you gently tighten your muscles and breathe evenly. Cat-cow Do these steps until your lower back bends more easily: 1. Get on your hands and knees on a firm surface. Keep your hands under  your shoulders, and keep your knees under your hips. You may put padding under your knees. 2. Let your head hang down toward your chest. Tighten (contract) the muscles in your belly. Point your tailbone toward the floor so your lower back becomes rounded like the back of a cat. 3. Stay in this position for 5 seconds. 4. Slowly lift your head. Let the muscles of your belly relax. Point your tailbone up toward the ceiling so your back forms a sagging arch like the back of a cow. 5. Stay in this position for 5 seconds.   Press-ups Do these steps 5-10 times in a row: 1. Lie on your belly (face-down) on the floor. 2. Place your hands near your head, about shoulder-width apart. 3. While you keep your back relaxed and keep your hips on the floor, slowly straighten your arms to raise the top half of your body and lift your shoulders. Do not use your back muscles. You may change where you place your hands in order to make yourself more comfortable. 4. Stay in this position for 5 seconds. 5. Slowly return to lying flat on the floor.   Bridges Do these steps 10 times in a row: 1. Lie on your back on a firm surface. 2. Bend your knees so they point up to the ceiling. Your  feet should be flat on the floor. Your arms should be flat at your sides, next to your body. 3. Tighten your butt muscles and lift your butt off the floor until your waist is almost as high as your knees. If you do not feel the muscles working in your butt and the back of your thighs, slide your feet 1-2 inches farther away from your butt. 4. Stay in this position for 3-5 seconds. 5. Slowly lower your butt to the floor, and let your butt muscles relax. If this exercise is too easy, try doing it with your arms crossed over your chest.   Belly crunches Do these steps 5-10 times in a row: 1. Lie on your back on a firm bed or the floor with your legs stretched out. 2. Bend your knees so they point up to the ceiling. Your feet should be flat  on the floor. 3. Cross your arms over your chest. 4. Tip your chin a little bit toward your chest but do not bend your neck. 5. Tighten your belly muscles and slowly raise your chest just enough to lift your shoulder blades a tiny bit off of the floor. Avoid raising your body higher than that, because it can put too much stress on your low back. 6. Slowly lower your chest and your head to the floor. Back lifts Do these steps 5-10 times in a row: 1. Lie on your belly (face-down) with your arms at your sides, and rest your forehead on the floor. 2. Tighten the muscles in your legs and your butt. 3. Slowly lift your chest off of the floor while you keep your hips on the floor. Keep the back of your head in line with the curve in your back. Look at the floor while you do this. 4. Stay in this position for 3-5 seconds. 5. Slowly lower your chest and your face to the floor. Contact a doctor if:  Your back pain gets a lot worse when you do an exercise.  Your back pain does not get better 2 hours after you exercise. If you have any of these problems, stop doing the exercises. Do not do them again unless your doctor says it is okay. Get help right away if:  You have sudden, very bad back pain. If this happens, stop doing the exercises. Do not do them again unless your doctor says it is okay. This information is not intended to replace advice given to you by your health care provider. Make sure you discuss any questions you have with your health care provider. Document Revised: 04/19/2018 Document Reviewed: 04/19/2018 Elsevier Patient Education  2021 [employer].

## 2021-01-06 NOTE — Assessment & Plan Note (Signed)
Continue Losartan and Amlodipine daily as prescribed. Continue to monitor blood pressure readings at home.  Follow up in two months.

## 2021-01-06 NOTE — Assessment & Plan Note (Signed)
    Current smoking consumption amount: 1/2 ppd a day . Dicsussion on advise to quit smoking and smoking impacts: Cardiovascular impacts  . Patient's willingness to quit: Might be willing to cut back on smoking  . Methods to quit smoking discussed: Behavioral modification nicotine replacement  . Medication management of smoking session drugs discussed: No medications as of yet to we can work-up the patient's neuro status  . Resources provided:  AVS   . Setting quit date not yet established  . Follow-up arranged 55month   Time spent counseling the patient: 5 min

## 2021-01-06 NOTE — Progress Notes (Signed)
Pt states he has been having a constant migraine. Pt states he has light sensitivity and his vision gets blurry  Pt states he is starting to forget

## 2021-01-07 ENCOUNTER — Other Ambulatory Visit: Payer: Self-pay

## 2021-01-19 ENCOUNTER — Ambulatory Visit: Payer: Self-pay | Admitting: General Surgery

## 2021-01-27 ENCOUNTER — Ambulatory Visit: Payer: Self-pay | Admitting: Critical Care Medicine
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
## 2021-03-08 ENCOUNTER — Other Ambulatory Visit: Payer: Self-pay

## 2021-03-08 ENCOUNTER — Encounter: Payer: Self-pay | Admitting: Critical Care Medicine

## 2021-03-08 ENCOUNTER — Ambulatory Visit: Payer: Self-pay | Attending: Critical Care Medicine | Admitting: Critical Care Medicine

## 2021-03-08 VITALS — BP 150/70 | HR 75 | Wt 149.2 lb

## 2021-03-08 DIAGNOSIS — I10 Essential (primary) hypertension: Secondary | ICD-10-CM

## 2021-03-08 DIAGNOSIS — Z23 Encounter for immunization: Secondary | ICD-10-CM

## 2021-03-08 DIAGNOSIS — Z720 Tobacco use: Secondary | ICD-10-CM

## 2021-03-08 MED ORDER — AMLODIPINE BESYLATE 10 MG PO TABS
10.0000 mg | ORAL_TABLET | Freq: Every day | ORAL | 3 refills | Status: DC
  Filled 2021-03-08: qty 30, 30d supply, fill #0

## 2021-03-08 MED ORDER — LOSARTAN POTASSIUM 25 MG PO TABS
25.0000 mg | ORAL_TABLET | Freq: Every day | ORAL | 1 refills | Status: DC
  Filled 2021-03-08: qty 30, 30d supply, fill #0

## 2021-03-08 NOTE — Assessment & Plan Note (Signed)
  .   Current smoking consumption amount: 5% nicotine vaping 1 cartridge daily  .  Marland Kitchen Dicsussion on advise to quit smoking and smoking impacts: Cardiovascular impacts  Patient's willingness to quit: Wants to quit completely . Methods to quit smoking discussed: Behavioral modification nicotine replacement using nicotine cartridge  Medication management of smoking session drugs discussed: Nicotine cartridge . Resources provided:  AVS   . Setting quit date in 6 weeks  . Follow-up arranged 4 months   Time spent counseling the patient: 5 min

## 2021-03-08 NOTE — Patient Instructions (Signed)
Refills on losartan and amlodipine sent to our pharmacy please get this filled  Continue to work on smoking cessation using the reduction in nicotine vaping method we discussed and follow through on this and also using the behavioral modification techniques we discussed  Follow a healthy diet  Prevnar 20 pneumococcal vaccine was given  Please obtain a COVID booster vaccine below is a number he can call to locate an appointment  Return to see Dr. Delford Field 4 months

## 2021-03-08 NOTE — Assessment & Plan Note (Signed)
The patient currently is out of all blood pressure medicines we will refill both the amlodipine and losartan for now

## 2021-07-07 NOTE — Progress Notes (Signed)
Established Patient Office Visit  Subjective:  Patient ID: Jon Nolan, male    DOB: 08-09-1985  Age: 35 y.o. MRN: [id number]  CC:  Chief Complaint  Patient presents with   Hypertension    HPI Jon Nolan presents for follow-up primary care.  He has had some pain in his neck area and he has recovered from cervical spine surgery with fusion.  Patient also complains of a new hernia in the right groin area.  He did agree to receive flu vaccine.  He does complain of significant grief reaction and depression and anxiety after the loss of his father from lung cancer this past summer.  Patient does work as a Training and development officer is yet to be able to achieve any type of insurance.  He would like to have his hernia evaluated but is uninsured.  He tried to apply for the orange card did not qualify because he makes too much money working as a Training and development officer.  He is looking at perhaps obtaining insurance health insurance exchange and needs assistance with this.   Past Medical History:  Diagnosis Date   Acute bilateral thoracic back pain 01/06/2021   Asthma    Atrial fibrillation (HCC)    Cervical disc disease with myelopathy 08/20/2020   Cervical spinal stenosis 09/07/2020   Chronic pain syndrome 08/20/2020   Concussion 01/06/2021   COVID-19 08/25/2020   Hypertension    Pre-diabetes    Protrusion of cervical intervertebral disc 09/03/2020   Rectal bleed 09/30/2020    Past Surgical History:  Procedure Laterality Date   ANTERIOR CERVICAL DECOMP/DISCECTOMY FUSION N/A 09/07/2020   Procedure: CERVICAL FOUR-FIVE ANTERIOR CERVICAL DECOMPRESSION/DISCECTOMY FUSION, ALLOGRAFT PLATE;  Surgeon: Marybelle Killings, MD;  Location: [facility];  Service: Orthopedics;  Laterality: N/A;   NO PAST SURGERIES      Family History  Problem Relation Age of Onset   Diabetes Mother    Hypertension Mother    Hypertension Father    Cancer Father    Colon cancer Father    Lung cancer Father     Social History   Socioeconomic  History   Marital status: Single    Spouse name: Not on file   Number of children: 3   Years of education: Not on file   Highest education level: Not on file  Occupational History   Occupation: cook at [employer]  Tobacco Use   Smoking status: Every Day    Packs/day: 0.25    Years: 13.00    Pack years: 3.25    Types: Cigarettes   Smokeless tobacco: Never  Vaping Use   Vaping Use: Former  Substance and Sexual Activity   Alcohol use: Yes    Alcohol/week: 3.0 standard drinks    Types: 3 Cans of beer per week    Comment: 3 week   Drug use: Yes    Types: Marijuana, Cocaine    Comment: no cocaine since 06/2020   Sexual activity: Not Currently  Other Topics Concern   Not on file  Social History Narrative   Right handed   Occasional prn caffeine   One story home   Social Determinants of Health   Financial Resource Strain: Not on file  Food Insecurity: Not on file  Transportation Needs: Not on file  Physical Activity: Not on file  Stress: Not on file  Social Connections: Not on file  Intimate Partner Violence: Not on file    Outpatient Medications Prior to Visit  Medication Sig Dispense Refill  BLACK ELDERBERRY PO Take 1 each by mouth daily. With Vit E and Zinc     amLODipine (NORVASC) 10 MG tablet Take 1 tablet (10 mg total) by mouth daily. 90 tablet 3   losartan (COZAAR) 25 MG tablet Take 1 tablet (25 mg total) by mouth daily. 90 tablet 1   cyclobenzaprine (FLEXERIL) 10 MG tablet Take 1 tablet (10 mg total) by mouth 3 (three) times daily as needed for muscle spasms. (Patient not taking: Reported on 07/08/2021) 60 tablet 0   gabapentin (NEURONTIN) 300 MG capsule Take 1 capsule (300 mg total) by mouth 3 (three) times daily. (Patient not taking: Reported on 07/08/2021) 90 capsule 3   No facility-administered medications prior to visit.    No Known Allergies  ROS Review of Systems  Constitutional:  Negative for chills, diaphoresis and fever.  HENT:  Negative for  congestion, hearing loss, nosebleeds, sore throat and tinnitus.   Eyes:  Negative for photophobia and redness.  Respiratory:  Negative for cough, shortness of breath, wheezing and stridor.   Cardiovascular:  Negative for chest pain, palpitations and leg swelling.  Gastrointestinal:  Negative for abdominal pain, blood in stool, constipation, diarrhea, nausea and vomiting.  Endocrine: Negative for polydipsia.  Genitourinary:  Negative for dysuria, flank pain, frequency, hematuria and urgency.  Musculoskeletal:  Positive for neck pain. Negative for back pain and myalgias.  Skin:  Negative for rash.  Allergic/Immunologic: Negative for environmental allergies.  Neurological:  Negative for dizziness, tremors, seizures, weakness and headaches.  Hematological:  Does not bruise/bleed easily.  Psychiatric/Behavioral:  Positive for decreased concentration and dysphoric mood. Negative for sleep disturbance and suicidal ideas. The patient is not nervous/anxious.      Objective:    Physical Exam Vitals reviewed.  Constitutional:      Appearance: Normal appearance. He is well-developed. He is not diaphoretic.  HENT:     Head: Normocephalic and atraumatic.     Nose: No nasal deformity, septal deviation, mucosal edema or rhinorrhea.     Right Sinus: No maxillary sinus tenderness or frontal sinus tenderness.     Left Sinus: No maxillary sinus tenderness or frontal sinus tenderness.     Mouth/Throat:     Pharynx: No oropharyngeal exudate.  Eyes:     General: No scleral icterus.    Conjunctiva/sclera: Conjunctivae normal.     Pupils: Pupils are equal, round, and reactive to light.  Neck:     Thyroid: No thyromegaly.     Vascular: No carotid bruit or JVD.     Trachea: Trachea normal. No tracheal tenderness or tracheal deviation.  Cardiovascular:     Rate and Rhythm: Normal rate and regular rhythm.     Chest Wall: PMI is not displaced.     Pulses: Normal pulses. No decreased pulses.     Heart  sounds: Normal heart sounds, S1 normal and S2 normal. Heart sounds not distant. No murmur heard. No systolic murmur is present.  No diastolic murmur is present.    No friction rub. No gallop. No S3 or S4 sounds.  Pulmonary:     Effort: No tachypnea, accessory muscle usage or respiratory distress.     Breath sounds: No stridor. No decreased breath sounds, wheezing, rhonchi or rales.  Chest:     Chest wall: No tenderness.  Abdominal:     General: Bowel sounds are normal. There is no distension.     Palpations: Abdomen is soft. Abdomen is not rigid.     Tenderness: There is no abdominal  tenderness. There is no guarding or rebound.     Hernia: A hernia is present.     Comments: Right inguinal hernia present reducible  Musculoskeletal:        General: Normal range of motion.     Cervical back: Normal range of motion and neck supple. No edema, erythema or rigidity. No muscular tenderness. Normal range of motion.  Lymphadenopathy:     Head:     Right side of head: No submental or submandibular adenopathy.     Left side of head: No submental or submandibular adenopathy.     Cervical: No cervical adenopathy.  Skin:    General: Skin is warm and dry.     Coloration: Skin is not pale.     Findings: No rash.     Nails: There is no clubbing.  Neurological:     Mental Status: He is alert and oriented to person, place, and time.     Sensory: No sensory deficit.  Psychiatric:        Speech: Speech normal.        Behavior: Behavior normal.    BP (!) 142/96 (BP Location: Right Arm)   Pulse 77   Resp 16   Wt 154 lb 9.6 oz (70.1 kg)   SpO2 99%   BMI 20.97 kg/m  Wt Readings from Last 3 Encounters:  07/08/21 154 lb 9.6 oz (70.1 kg)  03/08/21 149 lb 3.2 oz (67.7 kg)  01/06/21 156 lb 9.6 oz (71 kg)     Health Maintenance Due  Topic Date Due   COVID-19 Vaccine (3 - Booster for Moderna series) 12/25/2020    There are no preventive care reminders to display for this patient.  Lab Results   Component Value Date   TSH 1.240 07/06/2020   Lab Results  Component Value Date   WBC 7.4 10/29/2020   HGB 13.8 10/29/2020   HCT 42.3 10/29/2020   MCV 90.4 10/29/2020   PLT 223 10/29/2020   Lab Results  Component Value Date   NA 141 10/29/2020   K 3.6 10/29/2020   CO2 26 10/29/2020   GLUCOSE 94 10/29/2020   BUN 7 10/29/2020   CREATININE 1.13 10/29/2020   BILITOT 1.5 (H) 09/07/2020   ALKPHOS 41 09/07/2020   AST 21 09/07/2020   ALT 16 09/07/2020   PROT 6.2 (L) 09/07/2020   ALBUMIN 3.6 09/07/2020   CALCIUM 9.2 10/29/2020   ANIONGAP 8 10/29/2020   Lab Results  Component Value Date   CHOL 171 07/06/2020   Lab Results  Component Value Date   HDL 63 07/06/2020   Lab Results  Component Value Date   LDLCALC 91 07/06/2020   Lab Results  Component Value Date   TRIG 93 07/06/2020   Lab Results  Component Value Date   CHOLHDL 2.7 07/06/2020   Lab Results  Component Value Date   HGBA1C 5.9 (A) 07/06/2020      Assessment & Plan:   Problem List Items Addressed This Visit       Cardiovascular and Mediastinum   Primary hypertension - Primary    Hypertension not yet at goal  Continue amlodipine and losartan he had ran out of both medicines      Relevant Medications   amLODipine (NORVASC) 10 MG tablet   losartan (COZAAR) 25 MG tablet     Other   Tobacco use    Recommending nicotine replacement therapy     Current smoking consumption amount: 5% nicotine vaping 1 cartridge daily  Dicsussion on advise to quit smoking and smoking impacts: Cardiovascular impacts  Patient's willingness to quit: Wants to quit completely Methods to quit smoking discussed: Behavioral modification nicotine replacement using nicotine cartridge  Medication management of smoking session drugs discussed: Nicotine cartridge Resources provided:  AVS   Setting quit date in 6 weeks  Follow-up arranged 4 months   Time spent counseling the patient: 5 min      Status post  cervical spinal fusion    Referral back to orthopedics to evaluate chronic neck pain  Refill cyclobenzaprine for now      Relevant Orders   Ambulatory referral to Orthopedic Surgery   Right inguinal hernia    Observe for now patient will let me know when he obtains insurance health insurance exchange and will refer him to surgery      Other Visit Diagnoses     Need for immunization against influenza       Relevant Orders   Flu Vaccine QUAD 54mo+IM (Fluarix, Fluzone & Alfiuria Quad PF) (Completed)       Meds ordered this encounter  Medications   amLODipine (NORVASC) 10 MG tablet    Sig: Take 1 tablet (10 mg total) by mouth daily.    Dispense:  90 tablet    Refill:  3    d   cyclobenzaprine (FLEXERIL) 10 MG tablet    Sig: Take 1 tablet (10 mg total) by mouth 3 (three) times daily as needed for muscle spasms.    Dispense:  60 tablet    Refill:  2   losartan (COZAAR) 25 MG tablet    Sig: Take 1 tablet (25 mg total) by mouth daily.    Dispense:  90 tablet    Refill:  1    Follow-up: Return in about 4 months (around 11/06/2021).    Asencion Noble, MD

## 2021-07-08 ENCOUNTER — Ambulatory Visit: Payer: Self-pay | Attending: Critical Care Medicine | Admitting: Critical Care Medicine

## 2021-07-08 ENCOUNTER — Other Ambulatory Visit: Payer: Self-pay

## 2021-07-08 ENCOUNTER — Telehealth: Payer: Self-pay

## 2021-07-08 ENCOUNTER — Encounter: Payer: Self-pay | Admitting: Critical Care Medicine

## 2021-07-08 VITALS — BP 142/96 | HR 77 | Resp 16 | Wt 154.6 lb

## 2021-07-08 DIAGNOSIS — K4090 Unilateral inguinal hernia, without obstruction or gangrene, not specified as recurrent: Secondary | ICD-10-CM | POA: Insufficient documentation

## 2021-07-08 DIAGNOSIS — Z981 Arthrodesis status: Secondary | ICD-10-CM

## 2021-07-08 DIAGNOSIS — Z720 Tobacco use: Secondary | ICD-10-CM

## 2021-07-08 DIAGNOSIS — I10 Essential (primary) hypertension: Secondary | ICD-10-CM

## 2021-07-08 DIAGNOSIS — Z23 Encounter for immunization: Secondary | ICD-10-CM

## 2021-07-08 MED ORDER — LOSARTAN POTASSIUM 25 MG PO TABS: 25.0000 mg | ORAL_TABLET | Freq: Every day | ORAL | 1 refills | Status: DC

## 2021-07-08 MED ORDER — CYCLOBENZAPRINE HCL 10 MG PO TABS: 10.0000 mg | ORAL_TABLET | Freq: Three times a day (TID) | ORAL | 2 refills | Status: DC | PRN

## 2021-07-08 MED ORDER — AMLODIPINE BESYLATE 10 MG PO TABS
10.0000 mg | ORAL_TABLET | Freq: Every day | ORAL | 3 refills | Status: DC
Start: 2021-07-08 — End: 2021-11-25

## 2021-07-08 NOTE — Assessment & Plan Note (Signed)
Observe for now patient will let me know when he obtains insurance health insurance exchange and will refer him to surgery

## 2021-07-08 NOTE — Assessment & Plan Note (Signed)
Recommending nicotine replacement therapy     . Current smoking consumption amount: 5% nicotine vaping 1 cartridge daily  .  . Dicsussion on advise to quit smoking and smoking impacts: Cardiovascular impacts  Patient's willingness to quit: Wants to quit completely . Methods to quit smoking discussed: Behavioral modification nicotine replacement using nicotine cartridge  Medication management of smoking session drugs discussed: Nicotine cartridge . Resources provided:  AVS   . Setting quit date in 6 weeks  . Follow-up arranged 4 months   Time spent counseling the patient: 5 min 

## 2021-07-08 NOTE — Assessment & Plan Note (Signed)
Referral back to orthopedics to evaluate chronic neck pain  Refill cyclobenzaprine for now

## 2021-07-08 NOTE — Assessment & Plan Note (Signed)
Hypertension not yet at goal  Continue amlodipine and losartan he had ran out of both medicines

## 2021-07-08 NOTE — Telephone Encounter (Signed)
Met with the patient when he was in the clinic this morning. He is interested in obtaining his own insurance. Provided him with the phone number for the [place] with [employer] # [phone]

## 2021-07-08 NOTE — Progress Notes (Signed)
Patient states he may have a hernia, his pain is at a 8

## 2021-07-08 NOTE — Patient Instructions (Signed)
Our nurse case manager connected you to legal aid who can help you with sign up on the insurance exchange  Once you get insurance let us know and I can refer you to general surgery for your hernia  Dr. Ophelia Charter will be willing to see you without insurance for follow-up on your neck I Ernie Hew send you back over for him to take look at your neck  Trial of cyclobenzaprine 3 times daily as needed for neck spasm and follow neck exercises as outlined on the attachment  Focus on smoking cessation using nicotine lozenges 4 mg 3 times daily get these over-the-counter  Work note given for today  [place] our clinical social worker will call you for grief counseling  See Dr. Delford Field again in 4 months

## 2021-07-09 ENCOUNTER — Telehealth: Payer: Self-pay | Admitting: Critical Care Medicine

## 2021-07-09 NOTE — Telephone Encounter (Signed)
Please see this very nice young man face-to-face.  He has been through a lot.  He is now sober off cocaine.  He lost his father who is very close to this past summer who had lung cancer.  He is struggling somewhat to make ends meet cannot achieve any insurance as of yet.  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  He suffers from anxiety and severe grief reactions from the loss of his father.  I think at least 1 session you with you would be beneficial

## 2021-07-14 NOTE — Telephone Encounter (Signed)
Error

## 2021-07-20 ENCOUNTER — Other Ambulatory Visit: Payer: Self-pay

## 2021-07-20 ENCOUNTER — Encounter: Payer: Self-pay | Admitting: Orthopaedic Surgery

## 2021-07-20 ENCOUNTER — Ambulatory Visit (INDEPENDENT_AMBULATORY_CARE_PROVIDER_SITE_OTHER): Payer: Self-pay

## 2021-07-20 ENCOUNTER — Encounter: Payer: Self-pay | Admitting: Critical Care Medicine

## 2021-07-20 ENCOUNTER — Ambulatory Visit (INDEPENDENT_AMBULATORY_CARE_PROVIDER_SITE_OTHER): Payer: Self-pay | Admitting: Orthopaedic Surgery

## 2021-07-20 VITALS — BP 145/95 | HR 76 | Ht 72.0 in | Wt 154.0 lb

## 2021-07-20 DIAGNOSIS — M542 Cervicalgia: Secondary | ICD-10-CM

## 2021-07-20 DIAGNOSIS — Z981 Arthrodesis status: Secondary | ICD-10-CM

## 2021-07-20 NOTE — Progress Notes (Signed)
Office Visit Note   Patient: Jon Nolan           Date of Birth: 1986/07/01           MRN: [id number] Visit Date: 07/20/2021              Requested by: Storm Frisk, MD [address] [place],  [place] [address] PCP: Storm Frisk, MD   Assessment & Plan: Visit Diagnoses:  1. Neck pain   2. Status post cervical spinal fusion     Plan: We reviewed MRI scan preop.  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  All of the disc were normal by MRI scan.  We discussed due to the myelopathic changes in his cord he is expected to have some symptoms with some residual weakness and some tingling.  He is back functioning and is employed and is happy the surgical result.  He states he is waiting on a call from insurance agent and may be changing to a different insurance.  He can call later let me know and we can start some gabapentin low-dose 100 mg at night and gradually increase it to see if this gives him some improvement.  I discussed with him I would recommend he avoid narcotic medication for treatment of this.  Follow-Up Instructions: No follow-ups on file.   Orders:  Orders Placed This Encounter  Procedures   XR Cervical Spine 2 or 3 views   No orders of the defined types were placed in this encounter.     Procedures: No procedures performed   Clinical Data: No additional findings.   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  He states sometimes he has had some increased neck pain.  Sometimes he notes tingling in his hand not consistently.  Sometimes moving quickly is noticed some weakness in his right leg particular going up steps and has to be careful.  X-rays today show satisfactory healing of the single level C4-5 fusion.  Patient is here with his wife.  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  Review of Systems all other systems noncontributory to HPI.   Objective: Vital Signs: BP (!) 145/95    Pulse 76    Ht 6' (1.829 m)    Wt 154 lb (69.9 kg)    BMI 20.89 kg/m   Physical Exam Constitutional:      Appearance: He is well-developed.  HENT:     Head: Normocephalic and atraumatic.     Right Ear: External ear normal.     Left Ear: External ear normal.  Eyes:     Pupils: Pupils are equal, round, and reactive to light.  Neck:     Thyroid: No thyromegaly.     Trachea: No tracheal deviation.  Cardiovascular:     Rate and Rhythm: Normal rate.  Pulmonary:     Effort: Pulmonary effort is normal.     Breath sounds: No wheezing.  Abdominal:     General: Bowel sounds are normal.     Palpations: Abdomen is soft.  Musculoskeletal:     Cervical back: Neck supple.  Skin:    General: Skin is warm and dry.  Capillary Refill: Capillary refill takes less than 2 seconds.  Neurological:     Mental Status: He is alert and oriented to person, place, and time.  Psychiatric:        Behavior: Behavior normal.        Thought Content: Thought content normal.        Judgment: Judgment normal.    Ortho Exam patient is ambulatory without wide-based gait.  Biceps triceps are active and symmetrical.  No pain with cervical range of motion.  Specialty Comments:  No specialty comments available.  Imaging: No results found.   PMFS History: Patient Active Problem List   Diagnosis Date Noted   Right inguinal hernia 07/08/2021   Status post cervical spinal fusion 10/07/2020   Primary hypertension 07/06/2020   Tobacco use 07/06/2020   Past Medical History:  Diagnosis Date   Acute bilateral thoracic back pain 01/06/2021   Asthma    Atrial fibrillation (HCC)    Cervical disc disease with myelopathy 08/20/2020   Cervical spinal stenosis 09/07/2020   Chronic pain syndrome 08/20/2020    Concussion 01/06/2021   COVID-19 08/25/2020   Hypertension    Pre-diabetes    Protrusion of cervical intervertebral disc 09/03/2020   Rectal bleed 09/30/2020    Family History  Problem Relation Age of Onset   Diabetes Mother    Hypertension Mother    Hypertension Father    Cancer Father    Colon cancer Father    Lung cancer Father     Past Surgical History:  Procedure Laterality Date   ANTERIOR CERVICAL DECOMP/DISCECTOMY FUSION N/A 09/07/2020   Procedure: CERVICAL FOUR-FIVE ANTERIOR CERVICAL DECOMPRESSION/DISCECTOMY FUSION, ALLOGRAFT PLATE;  Surgeon: Eldred Manges, MD;  Location: [facility];  Service: Orthopedics;  Laterality: N/A;   NO PAST SURGERIES     Social History   Occupational History   Occupation: cook at [employer]  Tobacco Use   Smoking status: Every Day    Packs/day: 0.25    Years: 13.00    Pack years: 3.25    Types: Cigarettes   Smokeless tobacco: Never  Vaping Use   Vaping Use: Former  Substance and Sexual Activity   Alcohol use: Yes    Alcohol/week: 3.0 standard drinks    Types: 3 Cans of beer per week    Comment: 3 week   Drug use: Yes    Types: Marijuana, Cocaine    Comment: no cocaine since 06/2020   Sexual activity: Not Currently

## 2021-07-28 ENCOUNTER — Other Ambulatory Visit: Payer: Self-pay

## 2021-07-28 ENCOUNTER — Ambulatory Visit: Payer: Self-pay | Attending: Critical Care Medicine | Admitting: Clinical

## 2021-07-28 DIAGNOSIS — F331 Major depressive disorder, recurrent, moderate: Secondary | ICD-10-CM

## 2021-07-28 NOTE — BH Specialist Note (Signed)
Integrated Behavioral Health Initial In-Person Visit  MRN: [id number] Name: Jon Nolan  Number of Integrated Behavioral Health Clinician visits:: 1/6 Session Start time: 11:00am  Session End time: 11:50am Total time: 50  minutes  Types of Service: Individual psychotherapy  Interpretor:No. Interpretor Name and Language: N/A   Warm Hand Off Completed.        Subjective: Jon Nolan is a 35 y.o. male accompanied by  self Patient was referred by Shan Levans, MD for depression, grief, and anxiety. Patient reports the following symptoms/concerns: Reports feeling depressed, decreased interest in activities, trouble sleeping, decreased energy, decreased appetite, trouble concentrating, self-esteem disturbances, anxiousness, excessive worrying, trouble relaxing, and irritability. Reports that his father passed away this year. Reports having difficulty dealing with the loss of his father due him being his primary emotional support. Reports a hx of cocaine use. Reports stopping cocaine use several months ago. Reports marijuana and tobacco use. Reports often feeling like he has to be "strong" for his family. Duration of problem: 6 months; Severity of problem: moderate  Objective: Mood: Depressed and Affect: Appropriate and Tearful Risk of harm to self or others: No plan to harm self or others Reports thinking he is better off dead at times however denies SI/HI.   Life Context: Family and Social: Pt has children whom he is trying to get custody of again. Pt receives support from significant other. School/Work: Pt is employed. Self-Care: Pt endorses marijuana and tobacco use. Acknowledges wanting to stop tobacco use.  Life Changes: Pt's father passed away. Pt also has not seen his children due to his ex keeping their children from him.  Patient and/or Family's Strengths/Protective Factors: Concrete supports in place (healthy food, safe environments, etc.) and Sense of  purpose  Goals Addressed: Patient will: Reduce symptoms of: anxiety and depression Increase knowledge and/or ability of: coping skills  Demonstrate ability to: Increase healthy adjustment to current life circumstances and Begin healthy grieving over loss  Progress towards Goals: Ongoing  Interventions: Interventions utilized: Mindfulness or Management consultant, Behavioral Activation, Supportive Counseling, and Psychoeducation and/or Health Education  Standardized Assessments completed: C-SSRS Short, GAD-7, and PHQ 9 GAD 7 : Generalized Anxiety Score 07/28/2021 07/08/2021 03/08/2021 01/06/2021  Nervous, Anxious, on Edge 2 2 1 3   Control/stop worrying 3 3 0 3  Worry too much - different things 3 3 1 3   Trouble relaxing 3 3 0 3  Restless 2 1 0 2  Easily annoyed or irritable 3 3 3 2   Afraid - awful might happen 2 3 1  0  Total GAD 7 Score 18 18 6 16      Depression screen [facility] 2/9 07/28/2021 07/08/2021 03/08/2021 01/06/2021 09/30/2020  Decreased Interest 2 2 1 2  0  Down, Depressed, Hopeless 2 3 1 2  0  PHQ - 2 Score 4 5 2 4  0  Altered sleeping 2 0 1 2 3   Tired, decreased energy 1 0 0 0 0  Change in appetite 3 3 1 2  0  Feeling bad or failure about yourself  2 2 0 1 2  Trouble concentrating 3 1 1  0 0  Moving slowly or fidgety/restless 1 1 0 0 3  Suicidal thoughts 1 0 0 0 0  PHQ-9 Score 17 12 5 9 8     Flowsheet Row Integrated Behavioral Health from 07/28/2021 in [facility] ED from 12/31/2020 in [facility] EM[facility]GENCY DEPARTMENT ED from 10/29/2020 in [facility] EMERGENCY DEPARTMENT  C-SSRS RISK  CATEGORY Low Risk No Risk Error: Question 6 not populated       Patient and/or Family Response: Pt receptive to tx. Pt receptive to psychoeducation provided on grief, depression, and anxiety. Pt receptive to cognitive restructuring to decrease negative and unhelpful thoughts. Pt receptive to identifying schema with supporting everyone else and  not enough for himself. Pt receptive to utilizing deep breathing exercises and meditation.   Patient Centered Plan: Patient is on the following Treatment Plan(s):  Grief  Assessment: Endorses thoughts of being better of dead at times however denies SI/HI. Endorses protective factors as children. Pt provided with crisis resources. Denies auditory/visual hallucinations. Patient currently experiencing depression and anxiety associated with grief. Pt feels like he has helped his family to grieve but has not supported himself. Pt uses marijuana and tobacco to cope. Pt uninterested in marijuana cessation but is interested in tobacco cessation. LCSWA will discuss further with pt.   Patient may benefit from CBT. LCSWA provided psychoeducation on depression, anxiety, and grief. LCSWA utilized cognitive restructuring to decrease negative and unhelpful thoughts. LCSWA encouraged pt to utilize deep breathing and meditation. LCSWA will fu with pt.  Plan: Follow up with behavioral health clinician on : 08/25/21 Behavioral recommendations: Utilize provided crisis resources if SI arises with plan, means, and intent. Utilize deep breathing and meditation. Referral(s): Integrated [employer] (In Clinic) "From scale of 1-10, how likely are you to follow plan?": 10  Myleah Cavendish C Brittnay Pigman, LCSW

## 2021-08-25 ENCOUNTER — Ambulatory Visit: Payer: Self-pay | Attending: Critical Care Medicine | Admitting: Clinical

## 2021-08-25 ENCOUNTER — Other Ambulatory Visit: Payer: Self-pay

## 2021-08-25 DIAGNOSIS — F331 Major depressive disorder, recurrent, moderate: Secondary | ICD-10-CM
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
## 2021-09-08 ENCOUNTER — Telehealth: Payer: Self-pay | Admitting: Critical Care Medicine

## 2021-09-08 ENCOUNTER — Ambulatory Visit: Payer: Self-pay | Admitting: Clinical

## 2021-09-08 NOTE — Telephone Encounter (Signed)
Copied from CRM [id number]. Topic: General - Other >> Sep 08, 2021  9:56 AM McGill, Darlina Rumpf wrote: Reason for CRM: Pt requesting to reschedule appointment with SS today at 11:00 AM. Pt stated that he is unable to make it to the appointment this morning.

## 2021-09-10 NOTE — Telephone Encounter (Signed)
Spoke with pt and scheduled appt for 09/15/21.

## 2021-09-15 ENCOUNTER — Ambulatory Visit: Payer: Self-pay | Attending: Critical Care Medicine | Admitting: Clinical

## 2021-09-15 DIAGNOSIS — F331 Major depressive disorder, recurrent, moderate: Secondary | ICD-10-CM

## 2021-09-16 ENCOUNTER — Ambulatory Visit: Payer: Self-pay | Admitting: *Deleted

## 2021-09-16 ENCOUNTER — Other Ambulatory Visit (HOSPITAL_COMMUNITY)
Admission: RE | Admit: 2021-09-16 | Discharge: 2021-09-16 | Disposition: A | Discharge status: Home / Self Care | Payer: Self-pay | Source: Ambulatory Visit | Attending: Internal Medicine | Admitting: Internal Medicine

## 2021-09-16 ENCOUNTER — Ambulatory Visit: Payer: Self-pay | Attending: Internal Medicine | Admitting: Internal Medicine

## 2021-09-16 ENCOUNTER — Encounter: Payer: Self-pay | Admitting: Internal Medicine

## 2021-09-16 VITALS — BP 154/106 | Resp 16 | Wt 153.8 lb

## 2021-09-16 DIAGNOSIS — Z202 Contact with and (suspected) exposure to infections with a predominantly sexual mode of transmission: Secondary | ICD-10-CM

## 2021-09-16 DIAGNOSIS — I10 Essential (primary) hypertension: Secondary | ICD-10-CM

## 2021-09-16 MED ORDER — METRONIDAZOLE 500 MG PO TABS: 500.0000 mg | ORAL_TABLET | Freq: Two times a day (BID) | ORAL | 0 refills | Status: DC

## 2021-09-16 NOTE — Patient Instructions (Signed)
Please make sure that both you and your partner complete the antibiotics before having sex without a condom again.

## 2021-09-16 NOTE — Telephone Encounter (Signed)
Scheduled a MyChart apt with Dr. Laural Benes.  Patient aware.

## 2021-09-16 NOTE — Progress Notes (Signed)
Patient ID: Jon Nolan, male    DOB: 1985/08/16  MRN: [id number]  CC: Exposure to STD   Subjective: Jon Nolan is a 36 y.o. male who presents for UC visit.  His fiance is with him His concerns today include:  Pt with hx of tob dep, HTN  Pt requesting treatment for Trichomonas.  Fianc recently dx with Trichomonas and BV.  She shows me her results on Mychart from being tested on 09/13/2022 Patient himself denies any dysuria or penile dischg.  He has submitted some urine to be tested for STI.  They do have unprotected sex.  HTN:  BP noted to be elevated.  He has not taken Norvasc as yet for the morning  Patient Active Problem List   Diagnosis Date Noted   Right inguinal hernia 07/08/2021   Status post cervical spinal fusion 10/07/2020   Primary hypertension 07/06/2020   Tobacco use 07/06/2020     Current Outpatient Medications on File Prior to Visit  Medication Sig Dispense Refill   amLODipine (NORVASC) 10 MG tablet Take 1 tablet (10 mg total) by mouth daily. 90 tablet 3   BLACK ELDERBERRY PO Take 1 each by mouth daily. With Vit E and Zinc     cyclobenzaprine (FLEXERIL) 10 MG tablet Take 1 tablet (10 mg total) by mouth 3 (three) times daily as needed for muscle spasms. 60 tablet 2   losartan (COZAAR) 25 MG tablet Take 1 tablet (25 mg total) by mouth daily. 90 tablet 1   [DISCONTINUED] metoprolol succinate (TOPROL-XL) 50 MG 24 hr tablet Take 1 tablet (50 mg total) by mouth daily. Take with or immediately following a meal. (Patient not taking: Reported on 08/20/2020) 90 tablet 3   [DISCONTINUED] valsartan-hydrochlorothiazide (DIOVAN HCT) 80-12.5 MG tablet Take 1 tablet by mouth daily. 90 tablet 1   No current facility-administered medications on file prior to visit.    No Known Allergies  Social History   Socioeconomic History   Marital status: Single    Spouse name: Not on file   Number of children: 3   Years of education: Not on file   Highest education  level: Not on file  Occupational History   Occupation: cook at [employer]  Tobacco Use   Smoking status: Every Day    Packs/day: 0.25    Years: 13.00    Pack years: 3.25    Types: Cigarettes   Smokeless tobacco: Never  Vaping Use   Vaping Use: Former  Substance and Sexual Activity   Alcohol use: Yes    Alcohol/week: 3.0 standard drinks    Types: 3 Cans of beer per week    Comment: 3 week   Drug use: Yes    Types: Marijuana, Cocaine    Comment: no cocaine since 06/2020   Sexual activity: Not Currently  Other Topics Concern   Not on file  Social History Narrative   Right handed   Occasional prn caffeine   One story home   Social Determinants of Health   Financial Resource Strain: Not on file  Food Insecurity: Not on file  Transportation Needs: Not on file  Physical Activity: Not on file  Stress: Not on file  Social Connections: Not on file  Intimate Partner Violence: Not on file    Family History  Problem Relation Age of Onset   Diabetes Mother    Hypertension Mother    Hypertension Father    Cancer Father    Colon cancer Father  Lung cancer Father     Past Surgical History:  Procedure Laterality Date   ANTERIOR CERVICAL DECOMP/DISCECTOMY FUSION N/A 09/07/2020   Procedure: CERVICAL FOUR-FIVE ANTERIOR CERVICAL DECOMPRESSION/DISCECTOMY FUSION, ALLOGRAFT PLATE;  Surgeon: Marybelle Killings, MD;  Location: [facility];  Service: Orthopedics;  Laterality: N/A;   NO PAST SURGERIES      ROS: Review of Systems Negative except as stated above  PHYSICAL EXAM: BP (!) 154/106 (BP Location: Left Arm, Patient Position: Sitting, Cuff Size: Normal)    Resp 16    Wt 153 lb 12.8 oz (69.8 kg)    SpO2 100%    BMI 20.86 kg/m   Physical Exam   General appearance - alert, well appearing, young African-American male and in no distress Mental status - normal mood, behavior, speech, dress, motor activity, and thought processes  CMP Latest Ref Rng & Units 10/29/2020 09/07/2020 08/25/2020   Glucose 70 - 99 mg/dL 94 89 96  BUN 6 - 20 mg/dL 7 10 17   Creatinine 0.61 - 1.24 mg/dL 1.13 1.18 1.11  Sodium 135 - 145 mmol/L 141 134(L) 137  Potassium 3.5 - 5.1 mmol/L 3.6 3.6 4.0  Chloride 98 - 111 mmol/L 107 104 102  CO2 22 - 32 mmol/L 26 21(L) 23  Calcium 8.9 - 10.3 mg/dL 9.2 8.8(L) 9.6  Total Protein 6.5 - 8.1 g/dL - 6.2(L) -  Total Bilirubin 0.3 - 1.2 mg/dL - 1.5(H) -  Alkaline Phos 38 - 126 U/L - 41 -  AST 15 - 41 U/L - 21 -  ALT 0 - 44 U/L - 16 -   Lipid Panel     Component Value Date/Time   CHOL 171 07/06/2020 1719   TRIG 93 07/06/2020 1719   HDL 63 07/06/2020 1719   CHOLHDL 2.7 07/06/2020 1719   LDLCALC 91 07/06/2020 1719    CBC    Component Value Date/Time   WBC 7.4 10/29/2020 1738   RBC 4.68 10/29/2020 1738   HGB 13.8 10/29/2020 1738   HCT 42.3 10/29/2020 1738   PLT 223 10/29/2020 1738   MCV 90.4 10/29/2020 1738   MCH 29.5 10/29/2020 1738   MCHC 32.6 10/29/2020 1738   RDW 14.7 10/29/2020 1738   LYMPHSABS 1.4 06/25/2020 1523   MONOABS 0.5 06/25/2020 1523   EOSABS 0.2 06/25/2020 1523   BASOSABS 0.1 06/25/2020 1523    ASSESSMENT AND PLAN:  1. STD exposure Patient currently asymptomatic.  However because his partner has tested positive for trichomonas, I told him that we should go ahead and treat him as well.  Recommend that both of them complete antibiotic treatment before having unprotected sex again. - Urine cytology ancillary only    Patient was given the opportunity to ask questions.  Patient verbalized understanding of the plan and was able to repeat key elements of the plan.   No orders of the defined types were placed in this encounter.    Requested Prescriptions   Signed Prescriptions Disp Refills   metroNIDAZOLE (FLAGYL) 500 MG tablet 14 tablet 0    Sig: Take 1 tablet (500 mg total) by mouth 2 (two) times daily.    Return if symptoms worsen or fail to improve.  Karle Plumber, MD, FACP

## 2021-09-16 NOTE — Telephone Encounter (Signed)
°  Chief Complaint: requesting medication exposure to trichomoniasis Symptoms: none at this time  Frequency: 09/13/21 partner diagnosed Pertinent Negatives: Patient denies fever , penal discharge Disposition: [] ED /[x] Urgent Care (no appt availability in office) / [] Appointment(In office/virtual)/ []  [facility]/ [] Home Care/ [] Refused Recommended Disposition /[x] [facility]/ []  Follow-up with PCP Additional Notes:   Recommended [facility] walk in clinic. Requesting if medication can be sent to [facility] as listed on med list     Reason for Disposition  Sex partner of someone who was diagnosed with an STI  (Exception: Male exposed to bacterial vaginosis or vaginal yeast infection)  Answer Assessment - Initial Assessment Questions 1. MAIN CONCERN: "What were you exposed to?"  "What sexually transmitted infection (STI) does your sex partner have?" (e.g., gonorrhea, herpes, HIV, pubic lice)     [id number] partner dx with trichomoniasis 2. ROUTE of EXPOSURE: "How were you exposed to the STI?" (e.g., oral, vaginal, or rectal intercourse)    Na  3. DATE of EXPOSURE: "When did the exposure occur?" (e.g., days)     09/13/21 4. SYMPTOMS: "Do you have any symptoms?" (e.g., pain with urination, rash, sores)     Denies  5. PREGNANCY: "Is there any chance you are pregnant?" "When was your last menstrual period?"     na  Protocols used: STI Exposure-A-AH

## 2021-09-17 LAB — URINE CYTOLOGY ANCILLARY ONLY
Chlamydia: NEGATIVE
Comment: NEGATIVE
Comment: NEGATIVE
Comment: NORMAL
Neisseria Gonorrhea: NEGATIVE
Trichomonas: POSITIVE — AB

## 2021-09-22 ENCOUNTER — Telehealth: Payer: Self-pay | Admitting: Clinical

## 2021-09-22 NOTE — Telephone Encounter (Signed)
Christi with [facility] DSS returned call today. LCSW made CPS report on pt's statements in session (see note) about family in [place].
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
## 2021-09-22 NOTE — Telephone Encounter (Signed)
I attempted to contact [facility] DSS on 09/20/21 to complete report on pt's statement about family members in [place] (see BH) note. I left vm for a return call.

## 2021-10-09 ENCOUNTER — Other Ambulatory Visit: Payer: Self-pay

## 2021-10-09 ENCOUNTER — Emergency Department (HOSPITAL_BASED_OUTPATIENT_CLINIC_OR_DEPARTMENT_OTHER)
Admission: EM | Admit: 2021-10-09 | Discharge: 2021-10-10 | Disposition: A | Discharge status: Home / Self Care | Payer: Self-pay | Attending: Emergency Medicine | Admitting: Emergency Medicine

## 2021-10-09 ENCOUNTER — Encounter (HOSPITAL_BASED_OUTPATIENT_CLINIC_OR_DEPARTMENT_OTHER): Payer: Self-pay | Admitting: Emergency Medicine

## 2021-10-09 DIAGNOSIS — I10 Essential (primary) hypertension: Secondary | ICD-10-CM | POA: Insufficient documentation

## 2021-10-09 DIAGNOSIS — Z8616 Personal history of COVID-19: Secondary | ICD-10-CM | POA: Insufficient documentation

## 2021-10-09 DIAGNOSIS — Z79899 Other long term (current) drug therapy: Secondary | ICD-10-CM | POA: Insufficient documentation

## 2021-10-09 DIAGNOSIS — F17210 Nicotine dependence, cigarettes, uncomplicated: Secondary | ICD-10-CM | POA: Insufficient documentation

## 2021-10-09 DIAGNOSIS — J45909 Unspecified asthma, uncomplicated: Secondary | ICD-10-CM | POA: Insufficient documentation

## 2021-10-09 DIAGNOSIS — K029 Dental caries, unspecified: Secondary | ICD-10-CM | POA: Insufficient documentation

## 2021-10-09 MED ORDER — OXYCODONE-ACETAMINOPHEN 5-325 MG PO TABS
2.0000 | ORAL_TABLET | Freq: Once | ORAL | Status: AC
  Administered 2021-10-10: 2 via ORAL
  Filled 2021-10-09: qty 2

## 2021-10-09 MED ORDER — OXYCODONE-ACETAMINOPHEN 5-325 MG PO TABS
1.0000 | ORAL_TABLET | Freq: Once | ORAL | Status: DC
  Filled 2021-10-09: qty 1

## 2021-10-09 MED ORDER — AMOXICILLIN 500 MG PO CAPS: 500.0000 mg | ORAL_CAPSULE | Freq: Three times a day (TID) | ORAL | 0 refills | Status: DC

## 2021-10-09 MED ORDER — AMOXICILLIN 500 MG PO CAPS
500.0000 mg | ORAL_CAPSULE | Freq: Once | ORAL | Status: AC
  Administered 2021-10-10: 500 mg via ORAL
  Filled 2021-10-09: qty 1

## 2021-10-09 MED ORDER — ACETAMINOPHEN 500 MG PO TABS: 1000.0000 mg | ORAL_TABLET | Freq: Once | ORAL | Status: DC

## 2021-10-09 MED ORDER — NAPROXEN 250 MG PO TABS
500.0000 mg | ORAL_TABLET | Freq: Once | ORAL | Status: AC
  Administered 2021-10-10: 500 mg via ORAL
  Filled 2021-10-09: qty 2

## 2021-10-09 MED ORDER — NAPROXEN 375 MG PO TABS: ORAL_TABLET | ORAL | 0 refills | Status: DC

## 2021-10-09 MED ORDER — OXYCODONE-ACETAMINOPHEN 10-325 MG PO TABS: 1.0000 | ORAL_TABLET | Freq: Four times a day (QID) | ORAL | 0 refills | Status: DC | PRN

## 2021-10-09 NOTE — ED Provider Notes (Signed)
? ?[facility] ?Provider Note: Lowella Dell, MD, FACEP ? ?CSN: [id number] ?MRN: [id number] ?ARRIVAL: 10/09/21 at 2332 ?ROOM: DB013/DB013 ? ? ?CHIEF COMPLAINT  ?Dental Pain ? ? ?HISTORY OF PRESENT ILLNESS  ?10/09/21 11:42 PM ?Jon Nolan is a 36 y.o. male who has had head pain associated with his left upper third molar for several months.  It has acutely worsened over the past 2 days and he now rates the pain as a 10 out of 10, sharp and throbbing in nature.  It is worse with eating.  He had been taking Tylenol without relief.  He has not had Tylenol in over 24 hours.  He has seen a dentist in the past but did not have the money to pay for definitive treatment. ? ? ?Past Medical History:  ?Diagnosis Date  ? Acute bilateral thoracic back pain 01/06/2021  ? Asthma   ? Atrial fibrillation (HCC)   ? 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 Cervical spinal stenosis 09/07/2020  ? Chronic pain syndrome 08/20/2020  ? Concussion 01/06/2021  ? COVID-19 08/25/2020  ? Hypertension   ? Pre-diabetes   ? Protrusion of cervical intervertebral disc 09/03/2020  ? Rectal bleed 09/30/2020  ? ? ?Past Surgical History:  ?Procedure Laterality Date  ? ANTERIOR CERVICAL DECOMP/DISCECTOMY FUSION N/A 09/07/2020  ? Procedure: CERVICAL FOUR-FIVE ANTERIOR CERVICAL DECOMPRESSION/DISCECTOMY FUSION, ALLOGRAFT PLATE;  Surgeon: Eldred Manges, MD;  Location: [facility];  Service: Orthopedics;  Laterality: N/A;  ? NO PAST SURGERIES    ? ? ?Family History  ?Problem Relation Age of Onset  ? Diabetes Mother   ? Hypertension Mother   ? Hypertension Father   ? Cancer Father   ? Colon cancer Father   ? Lung cancer Father   ? ? ?Social History  ? ?Tobacco Use  ? Smoking status: Every Day  ?  Packs/day: 0.25  ?  Years: 13.00  ?  Pack years: 3.25  ?  Types: Cigarettes  ? Smokeless tobacco: Never  ?Vaping Use  ? Vaping Use: Former  ?Substance Use Topics  ? Alcohol use: Yes  ?  Alcohol/week: 3.0 standard drinks  ?  Types: 3 Cans of beer per week  ?   Comment: 3 week  ? Drug use: Yes  ?  Types: Marijuana, Cocaine  ?  Comment: no cocaine since 06/2020  ? ? ?Prior to Admission medications   ?Medication Sig Start Date End Date Taking? Authorizing Provider  ?amoxicillin (AMOXIL) 500 MG capsule Take 1 capsule (500 mg total) by mouth 3 (three) times daily. 10/09/21  Yes Marci Polito, MD  ?naproxen (NAPROSYN) 375 MG tablet Take 1 tablet twice daily for dental pain. 10/09/21  Yes Eleftheria Taborn, MD  ?oxyCODONE-acetaminophen (PERCOCET) 10-325 MG tablet Take 1 tablet by mouth every 6 (six) hours as needed (For severe pain). 10/09/21  Yes Missi Mcmackin, MD  ?amLODipine (NORVASC) 10 MG tablet Take 1 tablet (10 mg total) by mouth daily. 07/08/21   Storm Frisk, MD  ?BLACK ELDERBERRY PO Take 1 each by mouth daily. With Vit E and Zinc    [provider]  ?losartan (COZAAR) 25 MG tablet Take 1 tablet (25 mg total) by mouth daily. 07/08/21 10/06/21  Storm Frisk, MD  ?metoprolol succinate (TOPROL-XL) 50 MG 24 hr tablet Take 1 tablet (50 mg total) by mouth daily. Take with or immediately following a meal. ?Patient not taking: Reported on 08/20/2020 07/06/20 08/20/20  Storm Frisk, MD  ?valsartan-hydrochlorothiazide (DIOVAN HCT) 80-12.5  MG tablet Take 1 tablet by mouth daily. 08/20/20 09/04/20  Storm Frisk, MD  ? ? ?Allergies ?Patient has no known allergies. ? ? ?REVIEW OF SYSTEMS  ?Negative except as noted here or in the History of Present Illness. ? ? ?PHYSICAL EXAMINATION  ?Initial Vital Signs ?Blood pressure (!) 166/110, pulse 84, temperature 97.6 ?F (36.4 ?C), temperature source Oral, resp. rate 18, height 6' (1.829 m), weight 69.4 kg, SpO2 100 %. ? ?Examination ?General: Well-developed, well-nourished male in no acute distress; appearance consistent with age of record ?HENT: normocephalic; atraumatic; multiple carious molars, some to the gumline, tenderness to the left upper third molar ?Eyes: Normal appearance ?Neck: supple ?Heart: regular rate and  rhythm ?Lungs: clear to auscultation bilaterally ?Abdomen: soft; nondistended; nontender; bowel sounds present ?Extremities: No deformity; full range of motion ?Neurologic: Awake, alert and oriented; motor function intact in all extremities and symmetric; no facial droop ?Skin: Warm and dry ?Psychiatric: Normal mood and affect ? ? ?RESULTS  ?Summary of this visit's results, reviewed and interpreted by myself: ? ? EKG Interpretation ? ?Date/Time:    ?Ventricular Rate:    ?PR Interval:    ?QRS Duration:   ?QT Interval:    ?QTC Calculation:   ?R Axis:     ?Text Interpretation:   ?  ? ?  ? ?Laboratory Studies: ?No results found for this or any previous visit (from the past 24 hour(s)). ?Imaging Studies: ?No results found. ? ?ED COURSE and MDM  ?Nursing notes, initial and subsequent vitals signs, including pulse oximetry, reviewed and interpreted by myself. ? ?Vitals:  ? 10/09/21 2339 10/09/21 2340  ?BP: (!) 166/110   ?Pulse: 84   ?Resp: 18   ?Temp: 97.6 ?F (36.4 ?C)   ?TempSrc: Oral   ?SpO2: 100%   ?Weight:  69.4 kg  ?Height:  6' (1.829 m)  ? ?Medications  ?amoxicillin (AMOXIL) capsule 500 mg (has no administration in time range)  ?naproxen (NAPROSYN) tablet 500 mg (has no administration in time range)  ?oxyCODONE-acetaminophen (PERCOCET/ROXICET) 5-325 MG per tablet 2 tablet (has no administration in time range)  ? ?Patient advised that the definitive treatment he needs is extraction of his carious molars.  We will start him on an antibiotic and a brief course of narcotic pain medication and refer to the oral surgeon on-call, Dr. Chales Salmon. ? ? ?PROCEDURES  ?Procedures ? ? ?ED DIAGNOSES  ? ?  ICD-10-CM   ?1. Pain due to dental caries  K02.9   ?  ? ? ? ?  ?Nello Corro, MD ?10/10/21 0003 ? ?

## 2021-10-09 NOTE — ED Triage Notes (Signed)
?  Patient comes in with dental pain that has been off and on for a while.  Patient saw a dentist but did not have insurance.  Patient states he feels like his mouth is swollen and shoots into his cheek.  Pain is on L upper molar area and involves wisdom tooth.  Patient has been taking tylenol at home but has not had a dose in 24 hours.  Pain 10/10, sharp/throbbing. ?

## 2021-10-10 ENCOUNTER — Encounter (HOSPITAL_BASED_OUTPATIENT_CLINIC_OR_DEPARTMENT_OTHER): Payer: Self-pay | Admitting: Emergency Medicine

## 2021-10-10 ENCOUNTER — Emergency Department (HOSPITAL_BASED_OUTPATIENT_CLINIC_OR_DEPARTMENT_OTHER): Payer: Medicaid Other

## 2021-10-10 ENCOUNTER — Other Ambulatory Visit: Payer: Self-pay

## 2021-10-10 ENCOUNTER — Emergency Department (HOSPITAL_BASED_OUTPATIENT_CLINIC_OR_DEPARTMENT_OTHER)
Admission: EM | Admit: 2021-10-10 | Discharge: 2021-10-11 | Disposition: A | Discharge status: Home / Self Care | Payer: Medicaid Other | Attending: Emergency Medicine | Admitting: Emergency Medicine

## 2021-10-10 ENCOUNTER — Emergency Department (HOSPITAL_BASED_OUTPATIENT_CLINIC_OR_DEPARTMENT_OTHER): Payer: Medicaid Other | Admitting: Radiology

## 2021-10-10 DIAGNOSIS — Z79899 Other long term (current) drug therapy: Secondary | ICD-10-CM | POA: Insufficient documentation

## 2021-10-10 DIAGNOSIS — I10 Essential (primary) hypertension: Secondary | ICD-10-CM | POA: Insufficient documentation

## 2021-10-10 DIAGNOSIS — M549 Dorsalgia, unspecified: Secondary | ICD-10-CM | POA: Insufficient documentation

## 2021-10-10 DIAGNOSIS — M62838 Other muscle spasm: Secondary | ICD-10-CM

## 2021-10-10 DIAGNOSIS — Y92410 Unspecified street and highway as the place of occurrence of the external cause: Secondary | ICD-10-CM | POA: Insufficient documentation

## 2021-10-10 DIAGNOSIS — M542 Cervicalgia: Secondary | ICD-10-CM | POA: Insufficient documentation

## 2021-10-10 DIAGNOSIS — J45909 Unspecified asthma, uncomplicated: Secondary | ICD-10-CM | POA: Insufficient documentation

## 2021-10-10 MED ORDER — LIDOCAINE 5 % EX PTCH: 1.0000 | MEDICATED_PATCH | CUTANEOUS | 0 refills | Status: DC

## 2021-10-10 MED ORDER — CYCLOBENZAPRINE HCL 10 MG PO TABS: 10.0000 mg | ORAL_TABLET | Freq: Two times a day (BID) | ORAL | 0 refills | Status: DC | PRN
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
## 2021-10-11 ENCOUNTER — Ambulatory Visit: Payer: Self-pay | Admitting: Clinical

## 2021-10-12 ENCOUNTER — Telehealth: Payer: Self-pay

## 2021-10-12 NOTE — Telephone Encounter (Signed)
Pt needs an appointment with [place], wasn't able to get in contact with him or leave a vm, due to phone kept ringing. ?

## 2021-11-08 ENCOUNTER — Ambulatory Visit: Payer: Self-pay | Admitting: Critical Care Medicine

## 2021-11-25 ENCOUNTER — Other Ambulatory Visit: Payer: Self-pay

## 2021-11-25 ENCOUNTER — Encounter: Payer: Self-pay | Admitting: Critical Care Medicine

## 2021-11-25 ENCOUNTER — Ambulatory Visit: Payer: Self-pay | Attending: Critical Care Medicine | Admitting: Critical Care Medicine

## 2021-11-25 VITALS — BP 136/90 | HR 65 | Resp 16 | Wt 158.6 lb

## 2021-11-25 DIAGNOSIS — I480 Paroxysmal atrial fibrillation: Secondary | ICD-10-CM

## 2021-11-25 DIAGNOSIS — I10 Essential (primary) hypertension: Secondary | ICD-10-CM

## 2021-11-25 DIAGNOSIS — K4090 Unilateral inguinal hernia, without obstruction or gangrene, not specified as recurrent: Secondary | ICD-10-CM

## 2021-11-25 DIAGNOSIS — Z720 Tobacco use: Secondary | ICD-10-CM

## 2021-11-25 HISTORY — DX: Paroxysmal atrial fibrillation: I48.0

## 2021-11-25 MED ORDER — AMLODIPINE BESYLATE 10 MG PO TABS
10.0000 mg | ORAL_TABLET | Freq: Every day | ORAL | 3 refills | Status: DC
  Filled 2021-11-25: qty 30, 30d supply, fill #0

## 2021-11-25 MED ORDER — LOSARTAN POTASSIUM 25 MG PO TABS
25.0000 mg | ORAL_TABLET | Freq: Every day | ORAL | 1 refills | Status: DC
  Filled 2021-11-25: qty 30, 30d supply, fill #0

## 2021-11-25 NOTE — Patient Instructions (Signed)
Stay on amlodipine and losartan refill sent to our pharmacy downstairs ? ?Continue to work on smoking cessation see handout ? ?Review lifestyle medicine handout we gave you today particular as it relates to dietary choices on the first page ? ?Return to see Dr. Delford Field 4 months ? ? ?

## 2021-11-25 NOTE — Assessment & Plan Note (Signed)
Well-controlled at present we will continue losartan and amlodipine ?

## 2021-11-25 NOTE — Assessment & Plan Note (Signed)
Recommending nicotine replacement therapy     . Current smoking consumption amount: 5% nicotine vaping 1 cartridge daily  .  . Dicsussion on advise to quit smoking and smoking impacts: Cardiovascular impacts  Patient's willingness to quit: Wants to quit completely . Methods to quit smoking discussed: Behavioral modification nicotine replacement using nicotine cartridge  Medication management of smoking session drugs discussed: Nicotine cartridge . Resources provided:  AVS   . Setting quit date in 6 weeks  . Follow-up arranged 4 months   Time spent counseling the patient: 5 min 

## 2021-11-25 NOTE — Assessment & Plan Note (Signed)
Continued observation ?

## 2021-11-25 NOTE — Assessment & Plan Note (Signed)
This has resolved we will monitor 
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
## 2021-12-02 ENCOUNTER — Other Ambulatory Visit: Payer: Self-pay

## 2022-02-20 ENCOUNTER — Encounter: Payer: Self-pay | Admitting: Critical Care Medicine

## 2022-02-20 DIAGNOSIS — K4090 Unilateral inguinal hernia, without obstruction or gangrene, not specified as recurrent: Secondary | ICD-10-CM
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
## 2022-03-28 ENCOUNTER — Other Ambulatory Visit: Payer: Self-pay

## 2022-03-28 ENCOUNTER — Ambulatory Visit: Payer: Self-pay | Attending: Critical Care Medicine | Admitting: Critical Care Medicine

## 2022-03-28 ENCOUNTER — Encounter: Payer: Self-pay | Admitting: Critical Care Medicine

## 2022-03-28 VITALS — BP 142/97 | HR 70 | Ht 72.0 in | Wt 155.0 lb

## 2022-03-28 DIAGNOSIS — Z720 Tobacco use: Secondary | ICD-10-CM

## 2022-03-28 DIAGNOSIS — Z139 Encounter for screening, unspecified: Secondary | ICD-10-CM

## 2022-03-28 DIAGNOSIS — Z981 Arthrodesis status: Secondary | ICD-10-CM

## 2022-03-28 DIAGNOSIS — I10 Essential (primary) hypertension: Secondary | ICD-10-CM

## 2022-03-28 MED ORDER — AMLODIPINE BESYLATE 10 MG PO TABS
10.0000 mg | ORAL_TABLET | Freq: Every day | ORAL | 3 refills | Status: DC
  Filled 2022-03-28: qty 30, 30d supply, fill #0
  Filled 2022-06-18: qty 30, 30d supply, fill #1
  Filled 2022-08-03: qty 30, 30d supply, fill #2

## 2022-03-28 MED ORDER — CYCLOBENZAPRINE HCL 10 MG PO TABS
ORAL_TABLET | ORAL | 1 refills | Status: DC
  Filled 2022-03-28: qty 60, 20d supply, fill #0
  Filled 2022-06-18: qty 60, 20d supply, fill #1

## 2022-03-28 MED ORDER — LOSARTAN POTASSIUM-HCTZ 100-25 MG PO TABS
1.0000 | ORAL_TABLET | Freq: Every day | ORAL | 2 refills | Status: DC
  Filled 2022-03-28: qty 30, 30d supply, fill #0
  Filled 2022-06-18: qty 30, 30d supply, fill #1
  Filled 2022-08-03: qty 30, 30d supply, fill #2

## 2022-03-28 NOTE — Patient Instructions (Signed)
Increase losartan to losartan HCT 1 daily and stay on amlodipine daily for blood pressure  Reduce your tobacco intake as we discussed see attachment  Cyclobenzaprine refilled to take as needed  Complete health screening labs obtained at this visit  Return to Dr. Delford Field 1 month for blood pressure recheck

## 2022-03-28 NOTE — Assessment & Plan Note (Signed)
Recommending nicotine replacement therapy     . Current smoking consumption amount: 5% nicotine vaping 1 cartridge daily  .  . Dicsussion on advise to quit smoking and smoking impacts: Cardiovascular impacts  Patient's willingness to quit: Wants to quit completely . Methods to quit smoking discussed: Behavioral modification nicotine replacement using nicotine cartridge  Medication management of smoking session drugs discussed: Nicotine cartridge . Resources provided:  AVS   . Setting quit date in 6 weeks  . Follow-up arranged 4 months   Time spent counseling the patient: 5 min 

## 2022-03-28 NOTE — Assessment & Plan Note (Addendum)
Blood pressure is improved at this visit continue with amlodipine and losartan HCT  The losartan dose will be increased to 100 mg a day and HCTZ added at 25 mg a day continue with 10 mg a day amlodipine  The following Lifestyle Medicine recommendations according to [employer] of Lifestyle Medicine [facility]) were discussed and offered to patient who agrees to start the journey:  A. Whole Foods, Plant-based plate comprising of fruits and vegetables, plant-based proteins, whole-grain carbohydrates was discussed in detail with the patient.   A list for source of those nutrients were also provided to the patient.  Patient will use only water or unsweetened tea for hydration. B.  The need to stay away from risky substances including alcohol, smoking; obtaining 7 to 9 hours of restorative sleep, at least 150 minutes of moderate intensity exercise weekly, the importance of healthy social connections,  and stress reduction techniques were discussed. C.  A full color page of  Calorie density of various food groups per pound showing examples of each food groups was provided to the patient.

## 2022-03-28 NOTE — Assessment & Plan Note (Signed)
Plan to renew muscle relaxant

## 2022-03-29 LAB — CBC WITH DIFFERENTIAL/PLATELET
Basophils Absolute: 0.1 10*3/uL (ref 0.0–0.2)
Basos: 2 %
EOS (ABSOLUTE): 0.2 10*3/uL (ref 0.0–0.4)
Eos: 4 %
Hematocrit: 41.4 % (ref 37.5–51.0)
Hemoglobin: 14.3 g/dL (ref 13.0–17.7)
Immature Grans (Abs): 0 10*3/uL (ref 0.0–0.1)
Immature Granulocytes: 0 %
Lymphocytes Absolute: 1.9 10*3/uL (ref 0.7–3.1)
Lymphs: 28 %
MCH: 29.6 pg (ref 26.6–33.0)
MCHC: 34.5 g/dL (ref 31.5–35.7)
MCV: 86 fL (ref 79–97)
Monocytes Absolute: 0.7 10*3/uL (ref 0.1–0.9)
Monocytes: 10 %
Neutrophils Absolute: 3.7 10*3/uL (ref 1.4–7.0)
Neutrophils: 56 %
Platelets: 188 10*3/uL (ref 150–450)
RBC: 4.83 x10E6/uL (ref 4.14–5.80)
RDW: 13.5 % (ref 11.6–15.4)
WBC: 6.7 10*3/uL (ref 3.4–10.8)

## 2022-03-29 LAB — COMPREHENSIVE METABOLIC PANEL
ALT: 14 IU/L (ref 0–44)
AST: 14 IU/L (ref 0–40)
Albumin/Globulin Ratio: 1.9 (ref 1.2–2.2)
Albumin: 4.5 g/dL (ref 4.1–5.1)
Alkaline Phosphatase: 65 IU/L (ref 44–121)
BUN/Creatinine Ratio: 8 — ABNORMAL LOW (ref 9–20)
BUN: 9 mg/dL (ref 6–20)
Bilirubin Total: 0.6 mg/dL (ref 0.0–1.2)
CO2: 25 mmol/L (ref 20–29)
Calcium: 9.3 mg/dL (ref 8.7–10.2)
Chloride: 103 mmol/L (ref 96–106)
Creatinine, Ser: 1.18 mg/dL (ref 0.76–1.27)
Globulin, Total: 2.4 g/dL (ref 1.5–4.5)
Glucose: 84 mg/dL (ref 70–99)
Potassium: 5.1 mmol/L (ref 3.5–5.2)
Sodium: 141 mmol/L (ref 134–144)
Total Protein: 6.9 g/dL (ref 6.0–8.5)
eGFR: 83 mL/min/{1.73_m2} (ref >59)

## 2022-03-29 LAB — LIPID PANEL
Chol/HDL Ratio: 2.2 ratio (ref 0.0–5.0)
Cholesterol, Total: 146 mg/dL (ref 100–199)
HDL: 66 mg/dL (ref >39)
LDL Chol Calc (NIH): 71 mg/dL (ref 0–99)
Triglycerides: 37 mg/dL (ref 0–149)
VLDL Cholesterol Cal: 9 mg/dL (ref 5–40)

## 2022-03-29 LAB — HEMOGLOBIN A1C
Est. average glucose Bld gHb Est-mCnc: 123 mg/dL
Hgb A1c MFr Bld: 5.9 % — ABNORMAL HIGH (ref 4.8–5.6)

## 2022-03-29 NOTE — Progress Notes (Signed)
Kidney liver normal, A1C shows prediabetes  follow healthy diet no medication needed, cholesterol and blood count normal

## 2022-03-31 ENCOUNTER — Other Ambulatory Visit: Payer: Self-pay

## 2022-03-31 ENCOUNTER — Telehealth: Payer: Self-pay

## 2022-03-31 NOTE — Telephone Encounter (Signed)
-----   Message from Storm Frisk, MD sent at 03/29/2022  7:02 AM EDT ----- Kidney liver normal, A1C shows prediabetes  follow healthy diet no medication needed, cholesterol and blood count normal

## 2022-03-31 NOTE — Telephone Encounter (Signed)
Pt was called and is aware of results, DOB was confirmed.  ?

## 2022-04-11 ENCOUNTER — Other Ambulatory Visit: Payer: Self-pay

## 2022-04-11 ENCOUNTER — Emergency Department (HOSPITAL_BASED_OUTPATIENT_CLINIC_OR_DEPARTMENT_OTHER)
Admission: EM | Admit: 2022-04-11 | Discharge: 2022-04-11 | Disposition: A | Discharge status: Home / Self Care | Payer: Commercial Managed Care - HMO | Attending: Emergency Medicine | Admitting: Emergency Medicine

## 2022-04-11 DIAGNOSIS — K029 Dental caries, unspecified: Secondary | ICD-10-CM | POA: Diagnosis not present

## 2022-04-11 DIAGNOSIS — K0889 Other specified disorders of teeth and supporting structures: Secondary | ICD-10-CM | POA: Diagnosis present

## 2022-04-11 MED ORDER — CLINDAMYCIN HCL 300 MG PO CAPS: 300.0000 mg | ORAL_CAPSULE | Freq: Three times a day (TID) | ORAL | 0 refills | Status: AC

## 2022-04-11 MED ORDER — CLINDAMYCIN HCL 150 MG PO CAPS
300.0000 mg | ORAL_CAPSULE | Freq: Three times a day (TID) | ORAL | Status: DC
  Administered 2022-04-11: 300 mg via ORAL
  Filled 2022-04-11: qty 2

## 2022-04-11 MED ORDER — OXYCODONE HCL 5 MG PO TABS: 5.0000 mg | ORAL_TABLET | Freq: Four times a day (QID) | ORAL | 0 refills | Status: DC | PRN

## 2022-04-11 MED ORDER — OXYCODONE HCL 5 MG PO TABS
5.0000 mg | ORAL_TABLET | Freq: Once | ORAL | Status: AC
  Administered 2022-04-11: 5 mg via ORAL
  Filled 2022-04-11: qty 1

## 2022-04-11 NOTE — ED Triage Notes (Signed)
Patient arrives with complaints of dental pain. Patient states that this is an ongoing issue, but the pain has worsened over the past 2 days.  Patient states that he is planning to see a dentist when his insurance starts in a month.  Rates pain a 10/10.

## 2022-04-11 NOTE — ED Provider Notes (Signed)
[facility] EMERGENCY DEPT Provider Note   CSN: [id number] Arrival date & time: 04/11/22  1842     History  Chief Complaint  Patient presents with   Dental Pain   Dental Problem    Jon Nolan is a 36 y.o. male.  Left dental pain for the last several days.  Acute on chronic pain.  Has some chipped teeth in the left upper mouth but has been able to follow-up with dentistry.  Denies any fevers or chills.  No drooling, no difficulty opening mouth or eating or drinking.  Nothing makes it worse or better.  The history is provided by the patient.       Home Medications Prior to Admission medications   Medication Sig Start Date End Date Taking? Authorizing Provider  clindamycin (CLEOCIN) 300 MG capsule Take 1 capsule (300 mg total) by mouth 3 (three) times daily for 7 days. 04/11/22 04/18/22 Yes Cecily Lawhorne, DO  oxyCODONE (ROXICODONE) 5 MG immediate release tablet Take 1 tablet (5 mg total) by mouth every 6 (six) hours as needed for up to 5 doses for breakthrough pain. 04/11/22  Yes Edwin Baines, DO  amLODipine (NORVASC) 10 MG tablet Take 1 tablet (10 mg total) by mouth daily. 03/28/22   Storm Frisk, MD  BLACK ELDERBERRY PO Take 1 each by mouth daily. With Vit E and Zinc    [provider]  cyclobenzaprine (FLEXERIL) 10 MG tablet Take one tablet three times daily as needed for neck pain 03/28/22   Storm Frisk, MD  losartan-hydrochlorothiazide (HYZAAR) 100-25 MG tablet Take 1 tablet by mouth daily. 03/28/22   Storm Frisk, MD  metoprolol succinate (TOPROL-XL) 50 MG 24 hr tablet Take 1 tablet (50 mg total) by mouth daily. Take with or immediately following a meal. Patient not taking: Reported on 08/20/2020 07/06/20 08/20/20  Storm Frisk, MD  valsartan-hydrochlorothiazide (DIOVAN HCT) 80-12.5 MG tablet Take 1 tablet by mouth daily. 08/20/20 09/04/20  Storm Frisk, MD      Allergies    Patient has no known allergies.    Review of  Systems   Review of Systems  Physical Exam Updated Vital Signs BP (!) 138/90 (BP Location: Right Arm)   Pulse 78   Temp 98.9 F (37.2 C) (Oral)   Resp 18   Ht 6' (1.829 m)   Wt 72.6 kg   SpO2 99%   BMI 21.70 kg/m  Physical Exam HENT:     Head: Normocephalic.     Comments: Dental caries in the left upper teeth, there is one tooth that is fairly chipped but there is no obvious abscess or purulent drainage, no trismus or drooling    Nose: Nose normal.     Mouth/Throat:     Mouth: Mucous membranes are moist.  Neurological:     Mental Status: He is alert.     ED Results / Procedures / Treatments   Labs (all labs ordered are listed, but only abnormal results are displayed) Labs Reviewed - No data to display  EKG None  Radiology No results found.  Procedures Procedures    Medications Ordered in ED Medications  clindamycin (CLEOCIN) capsule 300 mg (has no administration in time range)  oxyCODONE (Oxy IR/ROXICODONE) immediate release tablet 5 mg (has no administration in time range)    ED Course/ Medical Decision Making/ A&P  Medical Decision Making Risk Prescription drug management.   Jon Nolan is here with dental pain.  Normal vitals.  No fever.  There is no signs of major infectious process.  I suspect he has an exposed nerve root that is causing discomfort intermittently here recently.  Will prescribe antibiotics and narcotic pain medicine for the next few days.  We will have him follow-up with dentistry.  Discharged in good condition.  Understands return precautions.  This chart was dictated using voice recognition software.  Despite best efforts to proofread,  errors can occur which can change the documentation meaning.         Final Clinical Impression(s) / ED Diagnoses Final diagnoses:  Pain due to dental caries    Rx / DC Orders ED Discharge Orders          Ordered    oxyCODONE (ROXICODONE) 5 MG immediate  release tablet  Every 6 hours PRN        04/11/22 1859    clindamycin (CLEOCIN) 300 MG capsule  3 times daily        04/11/22 1859              Virgina Norfolk, DO 04/11/22 1900

## 2022-04-13 ENCOUNTER — Encounter (HOSPITAL_BASED_OUTPATIENT_CLINIC_OR_DEPARTMENT_OTHER): Payer: Self-pay

## 2022-04-13 ENCOUNTER — Other Ambulatory Visit: Payer: Self-pay

## 2022-04-13 ENCOUNTER — Emergency Department (HOSPITAL_BASED_OUTPATIENT_CLINIC_OR_DEPARTMENT_OTHER)
Admission: EM | Admit: 2022-04-13 | Discharge: 2022-04-13 | Disposition: A | Discharge status: Home / Self Care | Payer: Commercial Managed Care - HMO | Attending: Emergency Medicine | Admitting: Emergency Medicine

## 2022-04-13 DIAGNOSIS — Z79899 Other long term (current) drug therapy: Secondary | ICD-10-CM | POA: Diagnosis not present

## 2022-04-13 DIAGNOSIS — K047 Periapical abscess without sinus: Secondary | ICD-10-CM | POA: Insufficient documentation

## 2022-04-13 DIAGNOSIS — K05219 Aggressive periodontitis, localized, unspecified severity: Secondary | ICD-10-CM

## 2022-04-13 MED ORDER — BENZOCAINE 20 % MT AERO
INHALATION_SPRAY | Freq: Once | OROMUCOSAL | Status: DC
  Filled 2022-04-13: qty 57

## 2022-04-13 NOTE — ED Triage Notes (Signed)
Pt. States he was here two days ago for left upper teeth pain and the Dr. Jovita Gamma him some pain medicine and antibiotics. States pain is 10/10 and worse than 2 days ago. Swelling and throbbing pain.

## 2022-04-13 NOTE — Discharge Instructions (Signed)
Continue taking the antibiotics that were prescribed to you.  Follow-up with the dentist as well. You had an abscess that was drained in the emergency room.  We recommend that you perform salt water gargles after every time you have something to eat or drink for the next 2 or 3 days.  Return to the emergency room if your symptoms get worse.

## 2022-04-13 NOTE — ED Provider Notes (Signed)
[facility] EMERGENCY DEPT Provider Note   CSN: [id number] Arrival date & time: 04/13/22  8242     History  Chief Complaint  Patient presents with   Dental Pain   Oral Swelling    Jon Nolan is a 36 y.o. male.  HPI     37 year old patient comes in with chief complaint of facial swelling.  Patient states that he was seen in the ER 2 days ago and started on clindamycin.  He is awaiting callback from dentist for follow-up.  However, this morning he woke up and had increased swelling to the left side of his face along with more pain.  He denies any difficulty in swallowing, difficulty in breathing.  Pain is causing it hard for him to open his mouth all the way.  Home Medications Prior to Admission medications   Medication Sig Start Date End Date Taking? Authorizing Provider  amLODipine (NORVASC) 10 MG tablet Take 1 tablet (10 mg total) by mouth daily. 03/28/22   Storm Frisk, MD  BLACK ELDERBERRY PO Take 1 each by mouth daily. With Vit E and Zinc    [provider]  clindamycin (CLEOCIN) 300 MG capsule Take 1 capsule (300 mg total) by mouth 3 (three) times daily for 7 days. 04/11/22 04/18/22  Virgina Norfolk, DO  cyclobenzaprine (FLEXERIL) 10 MG tablet Take one tablet three times daily as needed for neck pain 03/28/22   Storm Frisk, MD  losartan-hydrochlorothiazide (HYZAAR) 100-25 MG tablet Take 1 tablet by mouth daily. 03/28/22   Storm Frisk, MD  oxyCODONE (ROXICODONE) 5 MG immediate release tablet Take 1 tablet (5 mg total) by mouth every 6 (six) hours as needed for up to 5 doses for breakthrough pain. 04/11/22   Curatolo, Adam, DO  metoprolol succinate (TOPROL-XL) 50 MG 24 hr tablet Take 1 tablet (50 mg total) by mouth daily. Take with or immediately following a meal. Patient not taking: Reported on 08/20/2020 07/06/20 08/20/20  Storm Frisk, MD  valsartan-hydrochlorothiazide (DIOVAN HCT) 80-12.5 MG tablet Take 1 tablet by mouth daily.  08/20/20 09/04/20  Storm Frisk, MD      Allergies    Patient has no known allergies.    Review of Systems   Review of Systems  Physical Exam Updated Vital Signs BP (!) 149/89 (BP Location: Right Arm)   Pulse 78   Temp (!) 97.2 F (36.2 C) (Axillary)   Resp 17   Ht 6' (1.829 m)   Wt 72.6 kg   SpO2 100%   BMI 21.70 kg/m  Physical Exam Vitals and nursing note reviewed.  Constitutional:      Appearance: He is well-developed.  HENT:     Head: Atraumatic.     Mouth/Throat:     Comments: On exam, patient is noted to have gingival fluctuance over the left upper quadrant (tooth #9) Cardiovascular:     Rate and Rhythm: Normal rate.  Pulmonary:     Effort: Pulmonary effort is normal.  Musculoskeletal:     Cervical back: Neck supple.  Skin:    General: Skin is warm.  Neurological:     Mental Status: He is alert and oriented to person, place, and time.     ED Results / Procedures / Treatments   Labs (all labs ordered are listed, but only abnormal results are displayed) Labs Reviewed - No data to display  EKG None  Radiology No results found.  Procedures .Marland KitchenIncision and Drainage  Date/Time: 04/13/2022 3:39 PM  Performed by: Derwood Kaplan, MD Authorized by: Derwood Kaplan, MD   Consent:    Consent obtained:  Verbal   Consent given by:  Patient   Risks, benefits, and alternatives were discussed: yes     Risks discussed:  Bleeding, incomplete drainage, pain and damage to other organs Universal protocol:    Procedure explained and questions answered to patient or proxy's satisfaction: yes     Patient identity confirmed:  Arm band Location:    Type:  Abscess   Size:  2   Location:  Mouth   Mouth location: Maxillary alveolar ridge/gingival abscess. Pre-procedure details:    Skin preparation:  Antiseptic wash Sedation:    Sedation type:  None Anesthesia:    Anesthesia method:  Topical application   Topical anesthetic:  Benzocaine gel Procedure type:     Complexity:  Complex Procedure details:    Incision types:  Stab incision   Incision depth:  Subcutaneous   Wound management:  Probed and deloculated and irrigated with saline   Drainage:  Purulent and serosanguinous   Drainage amount:  Moderate   Wound treatment:  Wound left open Post-procedure details:    Procedure completion:  Tolerated well, no immediate complications     Medications Ordered in ED Medications  Benzocaine (HURRCAINE) 20 % mouth spray (has no administration in time range)    ED Course/ Medical Decision Making/ A&P                           Medical Decision Making Problems Addressed: Gingival abscess: complicated acute illness or injury  36 year old male comes in with chief complaint of facial swelling. He has known dental infection, awaiting dentist follow-up.  Differential diagnosis includes periapical abscess, facial abscess, gingival abscess.  Exam consistent with gingival abscess over the mandibular alveolar ridge.  No labs or imaging indicated.  Patient is nontoxic.  He tolerated bedside I&D well.  Wound care precautions discussed.  Advised him to continue taking his clindamycin.  Return precautions discussed.  Final Clinical Impression(s) / ED Diagnoses Final diagnoses:  Gingival abscess    Rx / DC Orders ED Discharge Orders     None         Derwood Kaplan, MD 04/13/22 [phone]

## 2022-04-13 NOTE — ED Notes (Signed)
Pt stated that he had not taken his BP meds. Lupita Leash - RN present during vitals.

## 2022-05-04 ENCOUNTER — Encounter: Payer: Self-pay | Admitting: Critical Care Medicine

## 2022-05-04 ENCOUNTER — Other Ambulatory Visit: Payer: Self-pay

## 2022-05-04 ENCOUNTER — Ambulatory Visit: Payer: Commercial Managed Care - HMO | Attending: Critical Care Medicine | Admitting: Critical Care Medicine

## 2022-05-04 VITALS — BP 142/90 | HR 70 | Ht 72.0 in | Wt 149.4 lb

## 2022-05-04 DIAGNOSIS — I10 Essential (primary) hypertension: Secondary | ICD-10-CM

## 2022-05-04 DIAGNOSIS — Z23 Encounter for immunization: Secondary | ICD-10-CM

## 2022-05-04 DIAGNOSIS — Z720 Tobacco use: Secondary | ICD-10-CM | POA: Diagnosis not present

## 2022-05-04 MED ORDER — CARVEDILOL 12.5 MG PO TABS
12.5000 mg | ORAL_TABLET | Freq: Two times a day (BID) | ORAL | 3 refills | Status: DC
  Filled 2022-05-04: qty 60, 30d supply, fill #0
  Filled 2022-06-18: qty 60, 30d supply, fill #1
  Filled 2022-08-03: qty 60, 30d supply, fill #2

## 2022-05-04 NOTE — Assessment & Plan Note (Signed)
Primary hypertension currently not at goal plan to add carvedilol 12.5 mg twice daily and work on smoking cessation using nicotine lozenge

## 2022-05-04 NOTE — Assessment & Plan Note (Signed)
Recommending nicotine replacement therapy     . Current smoking consumption amount: 5% nicotine vaping 1 cartridge daily  .  Marland Kitchen Dicsussion on advise to quit smoking and smoking impacts: Cardiovascular impacts  Patient's willingness to quit: Wants to quit completely . Methods to quit smoking discussed: Behavioral modification nicotine replacement using nicotine cartridge  Medication management of smoking session drugs discussed: Nicotine cartridge . Resources provided:  AVS   . Setting quit date in 6 weeks  . Follow-up arranged 4 months   Time spent counseling the patient: 5 min

## 2022-05-04 NOTE — Patient Instructions (Signed)
Start carvedilol 1 pill twice daily for blood pressure this was sent to our pharmacy downstairs  Stay on your other blood pressure medicines you have refills and remember he can take those on an empty stomach but with some water  Return to see Lurena Joiner our clinical pharmacist in 6 weeks Dr. Joya Gaskins in 3 months  Focus on smoking cessation purchase nicotine lozenge take 4 mg 2-3 times daily to quit smoking [facility] has the best pricing  Flu vaccine was given
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
## 2022-06-16 ENCOUNTER — Ambulatory Visit: Payer: Commercial Managed Care - HMO | Admitting: Pharmacist

## 2022-06-18 ENCOUNTER — Other Ambulatory Visit: Payer: Self-pay

## 2022-06-18 ENCOUNTER — Emergency Department (HOSPITAL_BASED_OUTPATIENT_CLINIC_OR_DEPARTMENT_OTHER): Payer: Commercial Managed Care - HMO | Admitting: Radiology

## 2022-06-18 DIAGNOSIS — Z79899 Other long term (current) drug therapy: Secondary | ICD-10-CM | POA: Insufficient documentation

## 2022-06-18 DIAGNOSIS — Y990 Civilian activity done for income or pay: Secondary | ICD-10-CM | POA: Diagnosis not present

## 2022-06-18 DIAGNOSIS — M542 Cervicalgia: Secondary | ICD-10-CM | POA: Diagnosis not present

## 2022-06-18 DIAGNOSIS — F17210 Nicotine dependence, cigarettes, uncomplicated: Secondary | ICD-10-CM | POA: Insufficient documentation

## 2022-06-18 DIAGNOSIS — Z8616 Personal history of COVID-19: Secondary | ICD-10-CM | POA: Insufficient documentation

## 2022-06-18 DIAGNOSIS — M545 Low back pain, unspecified: Secondary | ICD-10-CM | POA: Diagnosis present

## 2022-06-18 DIAGNOSIS — I10 Essential (primary) hypertension: Secondary | ICD-10-CM | POA: Insufficient documentation

## 2022-06-18 DIAGNOSIS — J45909 Unspecified asthma, uncomplicated: Secondary | ICD-10-CM | POA: Insufficient documentation

## 2022-06-18 DIAGNOSIS — W010XXA Fall on same level from slipping, tripping and stumbling without subsequent striking against object, initial encounter: Secondary | ICD-10-CM | POA: Diagnosis not present

## 2022-06-18 MED ORDER — OXYCODONE-ACETAMINOPHEN 5-325 MG PO TABS
1.0000 | ORAL_TABLET | ORAL | Status: DC | PRN
  Administered 2022-06-18: 1 via ORAL
  Filled 2022-06-18: qty 1

## 2022-06-18 NOTE — ED Triage Notes (Signed)
Pt states sustaining a mechanical fall around 7 am this morning. Pt landed on left side. Currently c/o neck pain and mid back pain. States concerns d/t previous back surgery in 2022. Pt ambulatory in triage.

## 2022-06-18 NOTE — ED Notes (Signed)
Pt asks for RN to assist him in lobby, states that he feels that his BP is elevated, his HA is worse, "no one has offered me anything for pain", and he does not understand why his neck has not been imaged yet. Explained that the EDP would assess in room for need for CT once in an exam room. Pain medication offered and accepted (see MAR). BP rechecked, not >180/120 to suggest increased acuity.

## 2022-06-18 NOTE — ED Triage Notes (Signed)
C collar applied in triage as pt c/o of neck tenderness

## 2022-06-19 ENCOUNTER — Emergency Department (HOSPITAL_BASED_OUTPATIENT_CLINIC_OR_DEPARTMENT_OTHER)
Admission: EM | Admit: 2022-06-19 | Discharge: 2022-06-19 | Disposition: A | Discharge status: Home / Self Care | Payer: Commercial Managed Care - HMO | Attending: Emergency Medicine | Admitting: Emergency Medicine

## 2022-06-19 ENCOUNTER — Emergency Department (HOSPITAL_BASED_OUTPATIENT_CLINIC_OR_DEPARTMENT_OTHER): Payer: Commercial Managed Care - HMO | Admitting: Radiology

## 2022-06-19 DIAGNOSIS — M549 Dorsalgia, unspecified: Secondary | ICD-10-CM

## 2022-06-19 DIAGNOSIS — W19XXXA Unspecified fall, initial encounter: Secondary | ICD-10-CM

## 2022-06-19 MED ORDER — ACETAMINOPHEN 325 MG PO TABS: 650.0000 mg | ORAL_TABLET | Freq: Four times a day (QID) | ORAL | 0 refills | Status: DC | PRN

## 2022-06-19 MED ORDER — IBUPROFEN 600 MG PO TABS: 600.0000 mg | ORAL_TABLET | Freq: Four times a day (QID) | ORAL | 0 refills | Status: DC | PRN

## 2022-06-19 MED ORDER — HYDROCODONE-ACETAMINOPHEN 5-325 MG PO TABS
1.0000 | ORAL_TABLET | Freq: Once | ORAL | Status: AC
  Administered 2022-06-19: 1 via ORAL
  Filled 2022-06-19: qty 1

## 2022-06-19 MED ORDER — OXYCODONE HCL 5 MG PO TABS: 5.0000 mg | ORAL_TABLET | ORAL | 0 refills | Status: DC | PRN

## 2022-06-19 MED ORDER — IBUPROFEN 800 MG PO TABS
800.0000 mg | ORAL_TABLET | Freq: Once | ORAL | Status: AC
  Administered 2022-06-19: 800 mg via ORAL
  Filled 2022-06-19: qty 1

## 2022-06-19 MED ORDER — LIDOCAINE 5 % EX PTCH
1.0000 | MEDICATED_PATCH | CUTANEOUS | Status: DC
  Administered 2022-06-19: 1 via TRANSDERMAL
  Filled 2022-06-19: qty 1

## 2022-06-19 NOTE — ED Notes (Signed)
Pt transported to XR.  

## 2022-06-19 NOTE — Discharge Instructions (Addendum)
Your x-rays did not show any fracture, your surgical hardware is intact.   It was a pleasure caring for you today in the emergency department.  Please return to the emergency department for any worsening or worrisome symptoms.      Please follow-up with your Primary Care Physician within the next week. Please take your medications as instructed and discuss any changes to your medications with your primary care physician.    Please return to the Emergency Department if you have any leg numbness, leg weakness, difficulty walking, fevers, worsening of pain, lightheadedness, lose consciousness, severe abdominal pain, severe headache, difficulty urinating, or difficulty having a bowel movement.   Please return to the emergency department immediately for any new or concerning symptoms, or if you get worse.

## 2022-06-19 NOTE — ED Provider Notes (Signed)
[facility] EMERGENCY DEPT Provider Note   CSN: [id number] Arrival date & time: 06/18/22  1912     History  Chief Complaint  Patient presents with   [facility] Jon Nolan is a 36 y.o. male.  Patient as above with significant medical history as below, including chronic back pain, AF not on AC, hypertension, cervical spine stenosis, status post cervical discectomy fusion Dr. Ophelia Charter 2022 who presents to the ED with complaint of fall.  Patient reports he slipped on the floor at work, mechanical fall.  Fell onto his buttock and left side.  No head injury.  No LOC.  He was ambulatory after the event.  Pain to his low back and to his neck.  No numbness or tingling, no gait disturbance.  No incontinence or change in urination or bowel function since the fall.  No overflow.  No saddle paresthesias.  No fevers or chills.  No abdominal pain.  No nausea or vomiting.  No head or face injury.  No medications prior to arrival.  Pain is improved since the onset while waiting in the lobby     Past Medical History:  Diagnosis Date   Acute bilateral thoracic back pain 01/06/2021   Asthma    Atrial fibrillation (HCC)    Cervical disc disease with myelopathy 08/20/2020   Cervical spinal stenosis 09/07/2020   Chronic pain syndrome 08/20/2020   Concussion 01/06/2021   COVID-19 08/25/2020   Hypertension    Paroxysmal atrial fibrillation (HCC) 11/25/2021   Pre-diabetes    Protrusion of cervical intervertebral disc 09/03/2020   Rectal bleed 09/30/2020    Past Surgical History:  Procedure Laterality Date   ANTERIOR CERVICAL DECOMP/DISCECTOMY FUSION N/A 09/07/2020   Procedure: CERVICAL FOUR-FIVE ANTERIOR CERVICAL DECOMPRESSION/DISCECTOMY FUSION, ALLOGRAFT PLATE;  Surgeon: Eldred Manges, MD;  Location: [facility] OR;  Service: Orthopedics;  Laterality: N/A;   NO PAST SURGERIES       The history is provided by the patient. No language interpreter was used.  Fall Pertinent negatives include no  chest pain, no abdominal pain, no headaches and no shortness of breath.       Home Medications Prior to Admission medications   Medication Sig Start Date End Date Taking? Authorizing Provider  amLODipine (NORVASC) 10 MG tablet Take 1 tablet (10 mg total) by mouth daily. 03/28/22   Storm Frisk, MD  BLACK ELDERBERRY PO Take 1 each by mouth daily. With Vit E and Zinc    [provider]  carvedilol (COREG) 12.5 MG tablet Take 1 tablet (12.5 mg total) by mouth 2 (two) times daily with a meal. 05/04/22   Storm Frisk, MD  cyclobenzaprine (FLEXERIL) 10 MG tablet Take one tablet three times daily as needed for neck pain 03/28/22   Storm Frisk, MD  losartan-hydrochlorothiazide (HYZAAR) 100-25 MG tablet Take 1 tablet by mouth daily. 03/28/22   Storm Frisk, MD  metoprolol succinate (TOPROL-XL) 50 MG 24 hr tablet Take 1 tablet (50 mg total) by mouth daily. Take with or immediately following a meal. Patient not taking: Reported on 08/20/2020 07/06/20 08/20/20  Storm Frisk, MD  valsartan-hydrochlorothiazide (DIOVAN HCT) 80-12.5 MG tablet Take 1 tablet by mouth daily. 08/20/20 09/04/20  Storm Frisk, MD      Allergies    Patient has no known allergies.    Review of Systems   Review of Systems  Constitutional:  Negative for chills and fever.  HENT:  Negative for facial swelling and  trouble swallowing.   Eyes:  Negative for photophobia and visual disturbance.  Respiratory:  Negative for cough and shortness of breath.   Cardiovascular:  Negative for chest pain and palpitations.  Gastrointestinal:  Negative for abdominal pain, nausea and vomiting.  Endocrine: Negative for polydipsia and polyuria.  Genitourinary:  Negative for difficulty urinating and hematuria.  Musculoskeletal:  Positive for back pain and neck pain. Negative for gait problem and joint swelling.  Skin:  Negative for pallor and rash.  Neurological:  Negative for syncope and headaches.   Psychiatric/Behavioral:  Negative for agitation and confusion.     Physical Exam Updated Vital Signs BP (!) 170/109 (BP Location: Right Arm)   Pulse 85   Temp 98 F (36.7 C) (Oral)   Resp 18   SpO2 100%  Physical Exam Vitals and nursing note reviewed.  Constitutional:      General: He is not in acute distress.    Appearance: Normal appearance. He is well-developed.  HENT:     Head: Normocephalic and atraumatic.     Right Ear: External ear normal.     Left Ear: External ear normal.     Mouth/Throat:     Mouth: Mucous membranes are moist.  Eyes:     General: No scleral icterus. Neck:      Comments: Reports some slight tightness at approx SCM on left/ trapezius left with upward neck movement. No paresthesias or radicular symptoms with neck movement.  Cardiovascular:     Rate and Rhythm: Normal rate and regular rhythm.     Pulses: Normal pulses.     Heart sounds: Normal heart sounds.  Pulmonary:     Effort: Pulmonary effort is normal. No tachypnea or respiratory distress.     Breath sounds: Normal breath sounds.  Abdominal:     General: Abdomen is flat.     Palpations: Abdomen is soft.     Tenderness: There is no abdominal tenderness.  Musculoskeletal:        General: Normal range of motion.     Cervical back: Full passive range of motion without pain and normal range of motion.       Back:     Right lower leg: No edema.     Left lower leg: No edema.     Comments: He has mild tenderness upon percussion of lumbar spine around L2-L3, no crepitus or stepoff.    Skin:    General: Skin is warm and dry.     Capillary Refill: Capillary refill takes less than 2 seconds.  Neurological:     Mental Status: He is alert and oriented to person, place, and time.     GCS: GCS eye subscore is 4. GCS verbal subscore is 5. GCS motor subscore is 6.     Cranial Nerves: Cranial nerves 2-12 are intact. No dysarthria.     Sensory: Sensation is intact.     Motor: Motor function is intact.  No weakness.     Coordination: Coordination is intact.     Gait: Gait is intact.     Comments: Strength 5/5 BLUE BLLE NVI BLLE No pain w/ log roll BLLE Pelvis stable to AP pressure b/l  Psychiatric:        Mood and Affect: Mood normal.        Behavior: Behavior normal.     ED Results / Procedures / Treatments   Labs (all labs ordered are listed, but only abnormal results are displayed) Labs Reviewed - No data to display  EKG None  Radiology DG Cervical Spine Complete  Result Date: 06/19/2022 CLINICAL DATA:  36 year old male status post fall at 0700 hours landing on left side. Neck and back pain. Prior surgery. EXAM: CERVICAL SPINE - COMPLETE 4+ VIEW COMPARISON:  CT cervical spine 10/10/2021 and earlier. FINDINGS: Stable straightening of cervical lordosis. Chronic C4-C5 ACDF with solid arthrodesis at that level, and bulky solid bridging chronic endplate osteophyte at [id number]. Bilateral posterior element alignment is within normal limits. Cervicothoracic junction alignment is within normal limits. Subtle dextroconvex cervical scoliosis is stable. C1-C2 alignment maintained. No acute osseous abnormality identified. Relatively preserved disc spaces at the non fused levels. Normal prevertebral soft tissue contour. Negative visible upper chest. IMPRESSION: 1. No acute osseous abnormality identified in the cervical spine. 2. 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 Electronically Signed   By: Genevie Ann M.D.   On: 06/19/2022 04:00   DG Lumbar Spine Complete  Result Date: 06/18/2022 CLINICAL DATA:  Fall EXAM: LUMBAR SPINE - COMPLETE 4+ VIEW COMPARISON:  MRI lumbar spine 08/13/2020, x-ray lumbar spine 12/31/2020 FINDINGS: Five non-rib-bearing lumbar vertebral bodies. There is no evidence of lumbar spine fracture. Alignment is normal. Intervertebral disc spaces are maintained. IMPRESSION: Negative. Electronically Signed   By: Iven Finn M.D.    On: 06/18/2022 22:31    Procedures Procedures    Medications Ordered in ED Medications  lidocaine (LIDODERM) 5 % 1 patch (1 patch Transdermal Patch Applied 06/19/22 0331)  HYDROcodone-acetaminophen (NORCO/VICODIN) 5-325 MG per tablet 1 tablet (1 tablet Oral Given 06/19/22 0330)  ibuprofen (ADVIL) tablet 800 mg (800 mg Oral Given 06/19/22 0330)    ED Course/ Medical Decision Making/ A&P                           Medical Decision Making Amount and/or Complexity of Data Reviewed Radiology: ordered.  Risk Prescription drug management.   This patient presents to the ED with chief complaint(s) of fall w/ back and neck pain with pertinent past medical history of prior spinal fusion, chronic pain which further complicates the presenting complaint. The complaint involves an extensive differential diagnosis and also carries with it a high risk of complications and morbidity.    The differential diagnosis includes but not limited to sprain strain fracture, msk  Differential diagnosis includes but is not exclusive to musculoskeletal back pain, renal colic, urinary tract infection, pyelonephritis, intra-abdominal causes of back pain, aortic aneurysm or dissection, cauda equina syndrome, sciatica, lumbar disc disease, thoracic disc disease, etc.  . Serious etiologies were considered.   The initial plan is to imaging, analgesia   Additional history obtained: Additional history obtained from  na Records reviewed  prior ed visits, prior labs/imaging/home meds  Independent labs interpretation:  The following labs were independently interpreted: na  Independent visualization of imaging: - I independently visualized the following imaging with scope of interpretation limited to determining acute life threatening conditions related to emergency care: lumbar xr c-spine xr, which revealed no acute process  Cardiac monitoring was reviewed and interpreted by myself which shows na  Treatment and  Reassessment: Norco, lidoderm, nsaid >> improved  Consultation: - Consulted or discussed management/test interpretation w/ external professional: na  Consideration for admission or further workup: Admission was considered   Pt feeling better, favor msk pain from recent fall, w/u today WNL, feeling better, gait steady, neuro intact, stable  for dc  Patient presents with low back pain without  signs of spinal cord compression, cauda equina syndrome, infection, aneurysm, or other serious etiology. The patient is neurologically intact. Given the extremely low risk of these diagnoses further testing and evaluation for these possibilities does not appear to be indicated at this time. Detailed discussions were had with the patient and/or family and caregivers, regarding current findings, and need for close f/u with PCP or on call doctor. The patient has been instructed to return immediately if the symptoms worsen in any way. Patient verbalized understanding and is in agreement with current care plan. All questions answered prior to discharge.     Social Determinants of health: Counseled patient for approximately 1 minutes regarding smoking cessation. Discussed risks of smoking and how they applied and affected their visit here today. Patient not ready to quit at this time, however will follow up with their primary doctor when they are.   CPT code: [phone]: intermediate counseling for smoking cessation   Social History   Tobacco Use   Smoking status: Every Day    Packs/day: 0.25    Years: 13.00    Total pack years: 3.25    Types: Cigarettes   Smokeless tobacco: Never  Vaping Use   Vaping Use: Former  Substance Use Topics   Alcohol use: Yes    Alcohol/week: 3.0 standard drinks of alcohol    Types: 3 Cans of beer per week    Comment: 3 week   Drug use: Yes    Types: Marijuana, Cocaine    Comment: no cocaine since 06/2020            Final Clinical Impression(s) / ED  Diagnoses Final diagnoses:  Fall, initial encounter  Acute back pain, unspecified back location, unspecified back pain laterality    Rx / DC Orders ED Discharge Orders     None         Jeanell Sparrow, DO 06/19/22 0455

## 2022-06-20 ENCOUNTER — Other Ambulatory Visit: Payer: Self-pay

## 2022-06-28 ENCOUNTER — Ambulatory Visit: Payer: Commercial Managed Care - HMO | Admitting: Pharmacist

## 2022-07-25 ENCOUNTER — Ambulatory Visit: Payer: Commercial Managed Care - HMO | Admitting: Pharmacist
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
## 2022-08-03 ENCOUNTER — Ambulatory Visit: Payer: Commercial Managed Care - HMO | Admitting: Critical Care Medicine

## 2022-08-11 ENCOUNTER — Other Ambulatory Visit: Payer: Self-pay

## 2022-08-11 ENCOUNTER — Telehealth: Payer: Self-pay | Admitting: Critical Care Medicine

## 2022-08-11 NOTE — Telephone Encounter (Signed)
Thank you , Jon Nolan can you also try to call tomorrow

## 2022-08-11 NOTE — Telephone Encounter (Signed)
Call placed to patient. Reviewed his fill records. Carvedilol was due to be refilled last month and was not. Additionally, his combination losartan/HCTZ was not refilled, nor was his amlodipine.   Unfortunately, I received his VM x3. Left HIPAA-compliant VM with instructions to return my call/set up a BP management appt. Unfortunately, he was due to follow-up with me on 12/18 and Dr. Joya Gaskins on 12/27. He no-showed both appointments. I left my personal office ext on the VM so he can call me back.

## 2022-08-11 NOTE — Telephone Encounter (Signed)
Pls reach out to this patient   his [facility] thinks he is not refilling his losartan hctz and carvedilol for his HTN  if not needs appt with you asap    Thank you Lurena Joiner

## 2022-08-12 NOTE — Telephone Encounter (Signed)
Called patient and he stated that he just filled HTN medication and needs to pick it up I schedule a appt with luke for a follow up. I also told patient to keep a record of bp readings.

## 2022-09-13 ENCOUNTER — Ambulatory Visit: Payer: Medicaid Other | Attending: Pharmacist | Admitting: Pharmacist

## 2022-09-15 ENCOUNTER — Encounter (HOSPITAL_COMMUNITY): Payer: Self-pay | Admitting: *Deleted

## 2022-11-29 ENCOUNTER — Telehealth: Payer: Self-pay | Admitting: Pharmacist

## 2022-11-29 NOTE — Progress Notes (Signed)
Patient attempted to be outreached by Wynn Maudlin, PharmD Candidate on 11/29/22 to discuss hypertension. Patient requested a call back at a later time.   Wynn Maudlin, PharmD Candidate   Catie Eppie Gibson, PharmD, BCACP, CPP [facility] [phone]

## 2022-12-25 ENCOUNTER — Encounter (HOSPITAL_BASED_OUTPATIENT_CLINIC_OR_DEPARTMENT_OTHER): Payer: Self-pay | Admitting: *Deleted

## 2022-12-25 ENCOUNTER — Emergency Department (HOSPITAL_BASED_OUTPATIENT_CLINIC_OR_DEPARTMENT_OTHER)
Admission: EM | Admit: 2022-12-25 | Discharge: 2022-12-25 | Disposition: A | Discharge status: Home / Self Care | Payer: Medicaid Other | Attending: Emergency Medicine | Admitting: Emergency Medicine

## 2022-12-25 ENCOUNTER — Other Ambulatory Visit: Payer: Self-pay

## 2022-12-25 DIAGNOSIS — X58XXXA Exposure to other specified factors, initial encounter: Secondary | ICD-10-CM | POA: Insufficient documentation

## 2022-12-25 DIAGNOSIS — T148XXA Other injury of unspecified body region, initial encounter: Secondary | ICD-10-CM

## 2022-12-25 DIAGNOSIS — I10 Essential (primary) hypertension: Secondary | ICD-10-CM | POA: Insufficient documentation

## 2022-12-25 DIAGNOSIS — J45909 Unspecified asthma, uncomplicated: Secondary | ICD-10-CM | POA: Insufficient documentation

## 2022-12-25 DIAGNOSIS — Z79899 Other long term (current) drug therapy: Secondary | ICD-10-CM | POA: Insufficient documentation

## 2022-12-25 DIAGNOSIS — S39012A Strain of muscle, fascia and tendon of lower back, initial encounter: Secondary | ICD-10-CM | POA: Insufficient documentation

## 2022-12-25 DIAGNOSIS — Z8616 Personal history of COVID-19: Secondary | ICD-10-CM | POA: Insufficient documentation

## 2022-12-25 DIAGNOSIS — I480 Paroxysmal atrial fibrillation: Secondary | ICD-10-CM | POA: Insufficient documentation

## 2022-12-25 MED ORDER — KETOROLAC TROMETHAMINE 60 MG/2ML IM SOLN
30.0000 mg | Freq: Once | INTRAMUSCULAR | Status: AC
  Administered 2022-12-25: 30 mg via INTRAMUSCULAR
  Filled 2022-12-25: qty 2

## 2022-12-25 NOTE — ED Triage Notes (Signed)
Pt is here for exacerbation of chronic back pain.  No recent trauma.  Pt reports that he has a known herniated disk in C4 C5.  Pt reports that pain has been severe today.

## 2022-12-25 NOTE — ED Provider Notes (Signed)
[place] EMERGENCY DEPARTMENT AT [facility] Provider Note  CSN: [id number] Arrival date & time: 12/25/22 0007  Chief Complaint(s) Back Pain  HPI Jon Nolan is a 37 y.o. male with a past medical history listed below including cervical disc myelopathy status post anterior approach cervical fixation, chronic back pain here for worsening of his mid upper back pain.  He reports this became worse at work approximately 10 hours prior to arrival.  Gradually worsened throughout the day.  Worse with movement.  He denies any falls or trauma.  No recent instrumentation.  No other physical complaints.  Improved with applying heat.  Has not taken any medicine since the pain got worse.  States that he normally takes Bayer back and body.   Back Pain   Past Medical History Past Medical History:  Diagnosis Date   Acute bilateral thoracic back pain 01/06/2021   Asthma    Atrial fibrillation (HCC)    Cervical disc disease with myelopathy 08/20/2020   Cervical spinal stenosis 09/07/2020   Chronic pain syndrome 08/20/2020   Concussion 01/06/2021   COVID-19 08/25/2020   Hypertension    Paroxysmal atrial fibrillation (HCC) 11/25/2021   Pre-diabetes    Protrusion of cervical intervertebral disc 09/03/2020   Rectal bleed 09/30/2020   Patient Active Problem List   Diagnosis Date Noted   Right inguinal hernia 07/08/2021   Status post cervical spinal fusion 10/07/2020   Primary hypertension 07/06/2020   Tobacco use 07/06/2020   Home Medication(s) Prior to Admission medications   Medication Sig Start Date End Date Taking? Authorizing Provider  acetaminophen (TYLENOL) 325 MG tablet Take 2 tablets (650 mg total) by mouth every 6 (six) hours as needed. 06/19/22   Sloan Leiter, DO  amLODipine (NORVASC) 10 MG tablet Take 1 tablet (10 mg total) by mouth daily. 03/28/22   Storm Frisk, MD  BLACK ELDERBERRY PO Take 1 each by mouth daily. With Vit E and Zinc    [provider]   carvedilol (COREG) 12.5 MG tablet Take 1 tablet (12.5 mg total) by mouth 2 (two) times daily with a meal. 05/04/22   Storm Frisk, MD  cyclobenzaprine (FLEXERIL) 10 MG tablet Take one tablet three times daily as needed for neck pain 03/28/22   Storm Frisk, MD  ibuprofen (ADVIL) 600 MG tablet Take 1 tablet (600 mg total) by mouth every 6 (six) hours as needed. 06/19/22   Sloan Leiter, DO  losartan-hydrochlorothiazide (HYZAAR) 100-25 MG tablet Take 1 tablet by mouth daily. 03/28/22   Storm Frisk, MD  oxyCODONE (ROXICODONE) 5 MG immediate release tablet Take 1 tablet (5 mg total) by mouth every 4 (four) hours as needed for severe pain. 06/19/22   Sloan Leiter, DO  metoprolol succinate (TOPROL-XL) 50 MG 24 hr tablet Take 1 tablet (50 mg total) by mouth daily. Take with or immediately following a meal. Patient not taking: Reported on 08/20/2020 07/06/20 08/20/20  Storm Frisk, MD  valsartan-hydrochlorothiazide (DIOVAN HCT) 80-12.5 MG tablet Take 1 tablet by mouth daily. 08/20/20 09/04/20  Storm Frisk, MD  Allergies Patient has no known allergies.  Review of Systems Review of Systems  Musculoskeletal:  Positive for back pain.   As noted in HPI  Physical Exam Vital Signs  I have reviewed the triage vital signs BP (!) 148/105   Pulse 70   Temp 98 F (36.7 C) (Oral)   Resp 16   SpO2 100%   Physical Exam Vitals reviewed.  Constitutional:      General: He is not in acute distress.    Appearance: He is well-developed. He is not diaphoretic.  HENT:     Head: Normocephalic and atraumatic.     Right Ear: External ear normal.     Left Ear: External ear normal.     Nose: Nose normal.     Mouth/Throat:     Mouth: Mucous membranes are moist.  Eyes:     General: No scleral icterus.    Conjunctiva/sclera: Conjunctivae normal.  Neck:      Trachea: Phonation normal.  Cardiovascular:     Rate and Rhythm: Normal rate and regular rhythm.  Pulmonary:     Effort: Pulmonary effort is normal. No respiratory distress.     Breath sounds: No stridor.  Abdominal:     General: There is no distension.  Musculoskeletal:        General: Normal range of motion.     Cervical back: Normal range of motion.     Thoracic back: Tenderness present. No spasms.       Back:  Neurological:     Mental Status: He is alert and oriented to person, place, and time.  Psychiatric:        Behavior: Behavior normal.     ED Results and Treatments Labs (all labs ordered are listed, but only abnormal results are displayed) Labs Reviewed - No data to display                                                                                                                       EKG  EKG Interpretation  Date/Time:    Ventricular Rate:    PR Interval:    QRS Duration:   QT Interval:    QTC Calculation:   R Axis:     Text Interpretation:         Radiology No results found.  Medications Ordered in ED Medications  ketorolac (TORADOL) injection 30 mg (30 mg Intramuscular Given 12/25/22 0101)  Procedures Procedures  (including critical care time)  Medical Decision Making / ED Course  Click here for ABCD2, HEART and other calculators  Medical Decision Making Risk Prescription drug management.    This patient presents to the ED for: Mid back pain  Co-morbidities/SDOH that complicate the patient evaluation/care: Noted in HPI  Presentation involves an extensive number of treatment options, and is a complaint that carries with it a high risk of complications and morbidity. The differential diagnosis includes but not limited to:  Muscle strain.  Considered but felt to be unlikely given history and exam include  dissection, PE, discitis, epidural abscess, pneumonia. Do not feel that labs or imaging are needed at this time.  Initial intervention:  Heat pack Toradol   Work up Interpretation and Management:  Laboratory Tests ordered listed below with my independent interpretation:     Imaging Studies ordered listed below with my independent interpretation:   ED Course:  Pain did improve with heat pack. Supportive management recommended.        Final Clinical Impression(s) / ED Diagnoses Final diagnoses:  Muscle strain   The patient appears reasonably screened and/or stabilized for discharge and I doubt any other medical condition or other [facility] requiring further screening, evaluation, or treatment in the ED at this time. I have discussed the findings, Dx and Tx plan with the patient/family who expressed understanding and agree(s) with the plan. Discharge instructions discussed at length. The patient/family was given strict return precautions who verbalized understanding of the instructions. No further questions at time of discharge.  Disposition: Discharge  Condition: Good  ED Discharge Orders     None         Follow Up: Storm Frisk, MD [address] [place] [address] [phone]  Call  to schedule an appointment for close follow up           This chart was dictated using voice recognition software.  Despite best efforts to proofread,  errors can occur which can change the documentation meaning.    Nira Conn, MD 12/25/22 [phone]

## 2022-12-25 NOTE — Discharge Instructions (Signed)
You may use over-the-counter Motrin (Ibuprofen), Acetaminophen (Tylenol), topical muscle creams such as SalonPas, Icy Hot, Bengay, etc. Please stretch, apply ice or heat (whichever helps), and have massage therapy for additional assistance.  

## 2022-12-25 NOTE — ED Notes (Signed)
Warm pack applied to base of neck/upper back for comfort.  Warm blanket given

## 2023-02-11 IMAGING — DX DG CHEST 2V
2 series · 2 of 2 positions shown · non-contrast
Comparison: 08/25/2020

CLINICAL DATA: Left-sided chest pain, short of breath

EXAM:
CHEST - 2 VIEW

[chest pa]
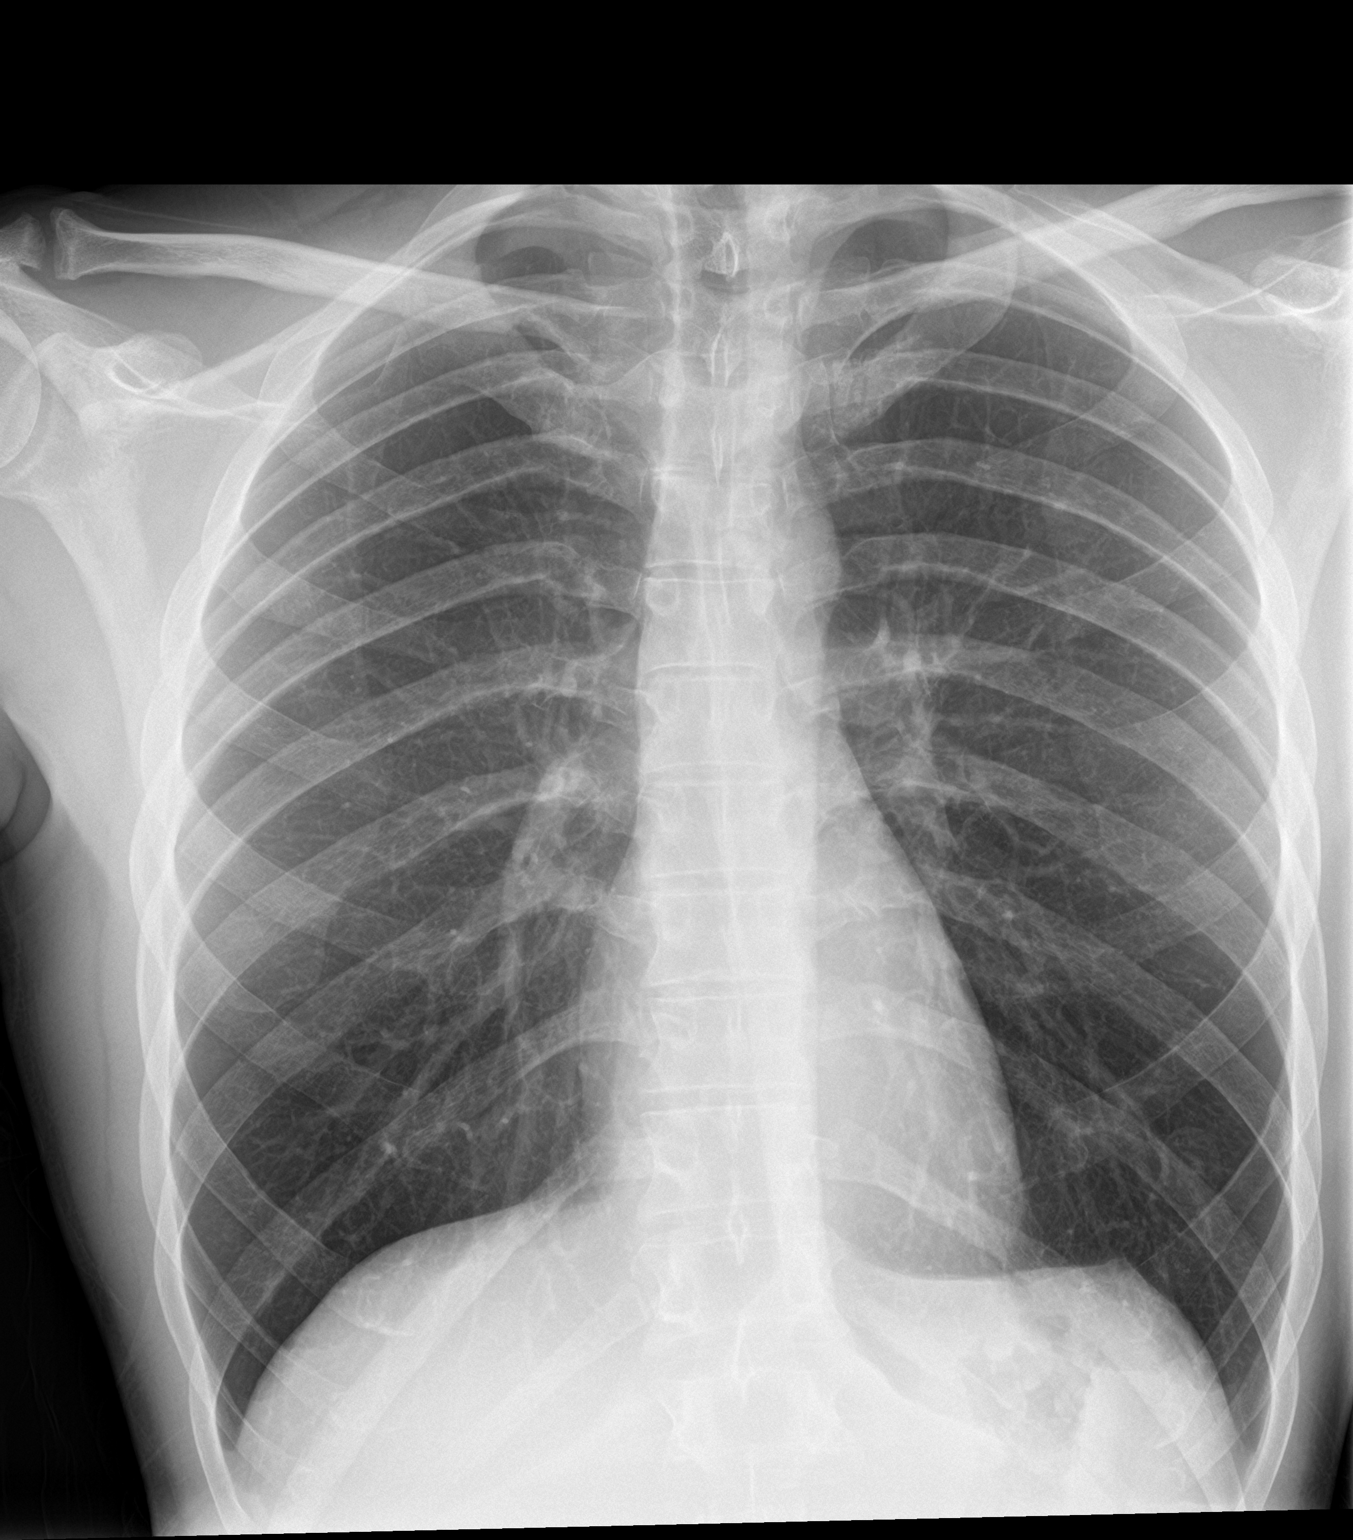

[chest lat]
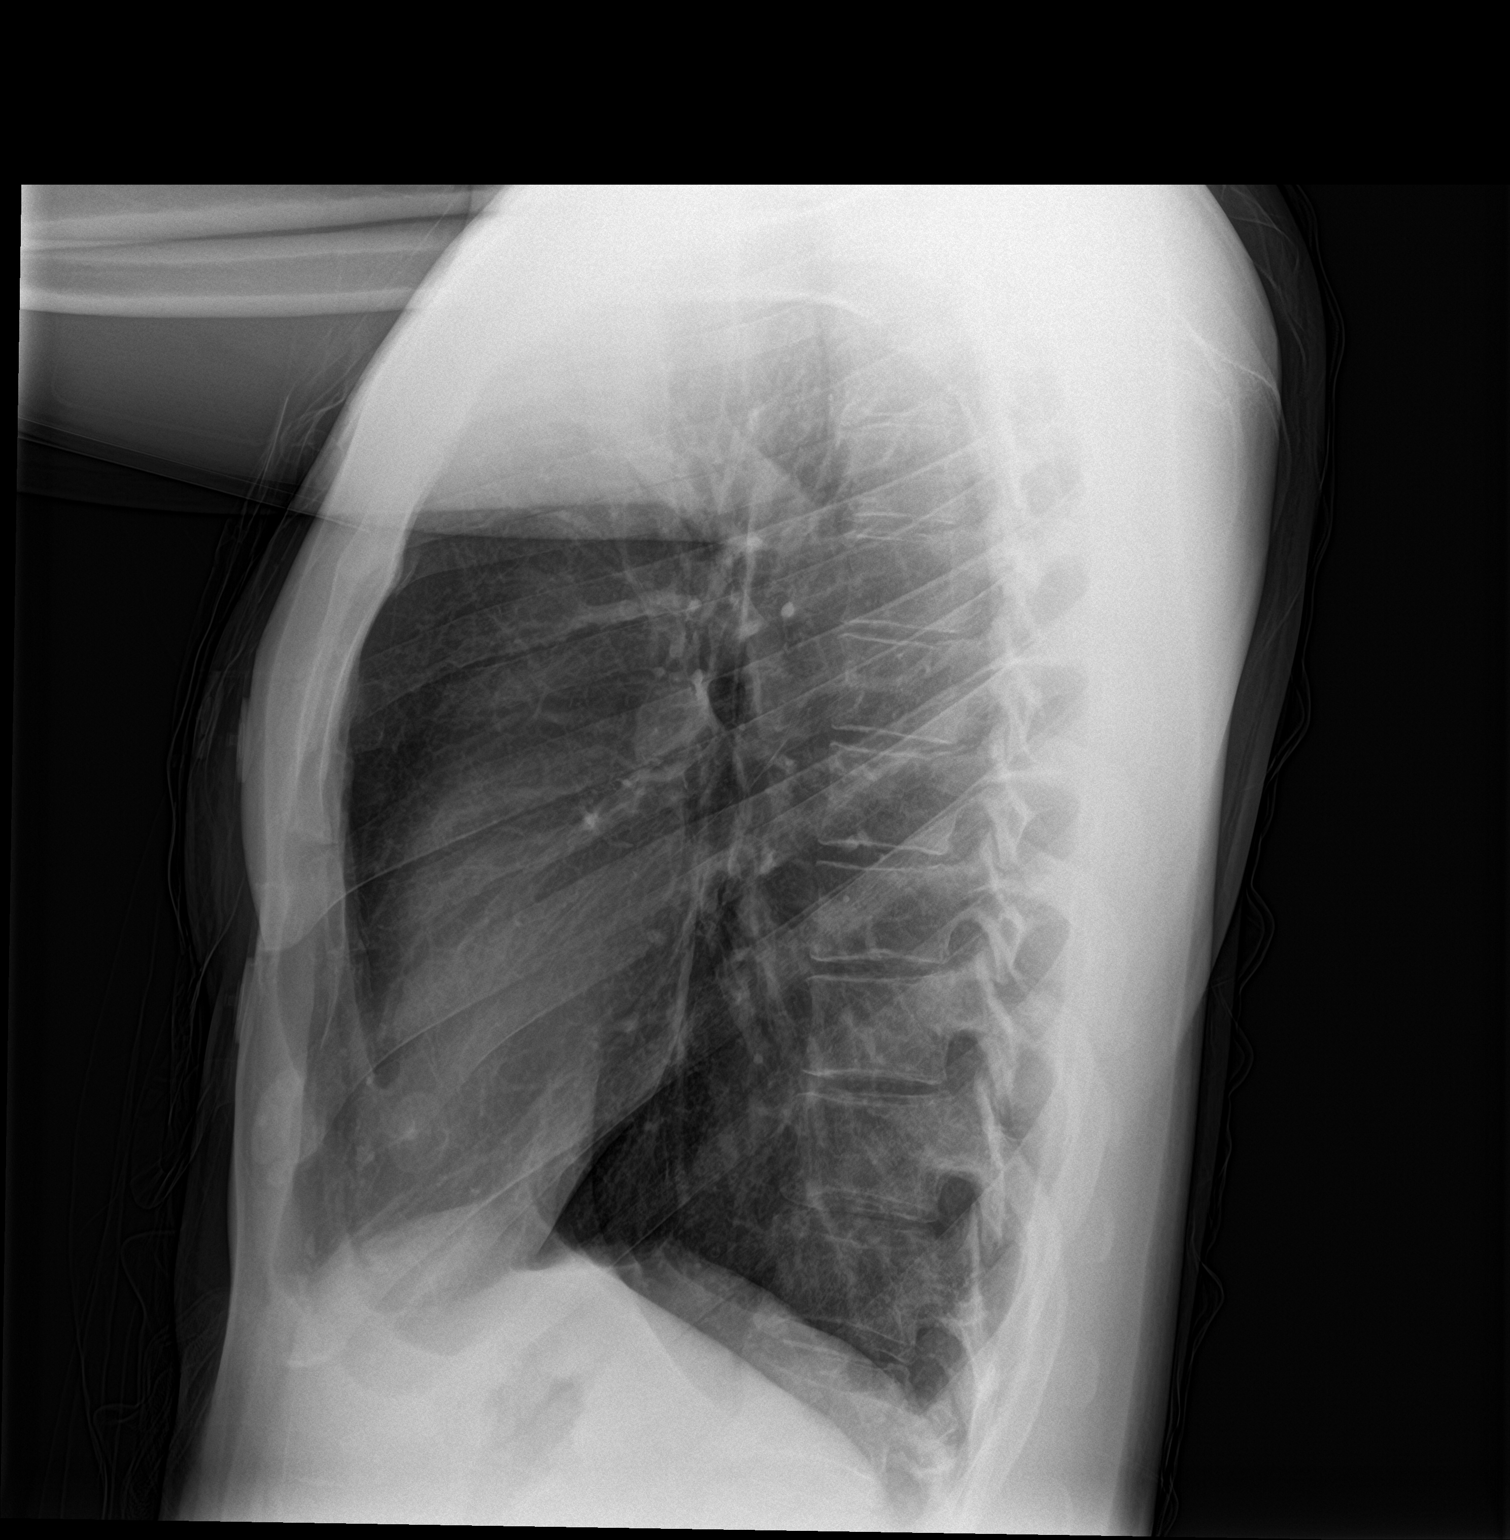

[2 of 2 positions shown; findings below may reference images not displayed]

FINDINGS: The heart size and mediastinal contours are within normal limits.
Both lungs are clear. The visualized skeletal structures are
unremarkable.
IMPRESSION: No active cardiopulmonary disease.
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
## 2023-10-05 ENCOUNTER — Other Ambulatory Visit: Payer: Self-pay

## 2023-10-05 ENCOUNTER — Emergency Department (HOSPITAL_BASED_OUTPATIENT_CLINIC_OR_DEPARTMENT_OTHER)
Admission: EM | Admit: 2023-10-05 | Discharge: 2023-10-05 | Disposition: A | Discharge status: Home / Self Care | Payer: Self-pay | Attending: Emergency Medicine | Admitting: Emergency Medicine

## 2023-10-05 ENCOUNTER — Emergency Department (HOSPITAL_BASED_OUTPATIENT_CLINIC_OR_DEPARTMENT_OTHER): Payer: Self-pay | Admitting: Radiology

## 2023-10-05 DIAGNOSIS — X501XXA Overexertion from prolonged static or awkward postures, initial encounter: Secondary | ICD-10-CM | POA: Insufficient documentation

## 2023-10-05 DIAGNOSIS — Z79899 Other long term (current) drug therapy: Secondary | ICD-10-CM | POA: Insufficient documentation

## 2023-10-05 DIAGNOSIS — S39012A Strain of muscle, fascia and tendon of lower back, initial encounter: Secondary | ICD-10-CM | POA: Insufficient documentation

## 2023-10-05 DIAGNOSIS — I4891 Unspecified atrial fibrillation: Secondary | ICD-10-CM | POA: Insufficient documentation

## 2023-10-05 MED ORDER — KETOROLAC TROMETHAMINE 15 MG/ML IJ SOLN
15.0000 mg | Freq: Once | INTRAMUSCULAR | Status: AC
  Administered 2023-10-05: 15 mg via INTRAMUSCULAR
  Filled 2023-10-05: qty 1

## 2023-10-05 MED ORDER — CYCLOBENZAPRINE HCL 10 MG PO TABS: ORAL_TABLET | ORAL | 1 refills | Status: DC

## 2023-10-05 MED ORDER — LIDOCAINE 5 % EX PTCH
1.0000 | MEDICATED_PATCH | CUTANEOUS | Status: DC
  Administered 2023-10-05: 1 via TRANSDERMAL
  Filled 2023-10-05: qty 1

## 2023-10-05 MED ORDER — NAPROXEN 500 MG PO TABS: 500.0000 mg | ORAL_TABLET | Freq: Two times a day (BID) | ORAL | 0 refills | Status: DC

## 2023-10-05 MED ORDER — LIDOCAINE 5 % EX PTCH
1.0000 | MEDICATED_PATCH | CUTANEOUS | 0 refills | Status: AC
Start: 2023-10-05 — End: ?

## 2023-10-05 MED ORDER — HYDROCODONE-ACETAMINOPHEN 5-325 MG PO TABS
1.0000 | ORAL_TABLET | Freq: Once | ORAL | Status: AC
  Administered 2023-10-05: 1 via ORAL
  Filled 2023-10-05: qty 1

## 2023-10-05 NOTE — Discharge Instructions (Addendum)
 I would recommend taking naproxen twice daily can also take Tylenol 1000 mg every 6 hours.  He can use ice and heat over area of pain.  It also is sent lidocaine patches to your pharmacy which you can take as needed.  I would recommend gentle stretching.  If you have breakthrough pain you can try muscle relaxer.  Do not take this medication when you are driving or operating heavy machinery.  Follow-up with primary care.

## 2023-10-05 NOTE — ED Triage Notes (Signed)
 Reports heavy lifting and stepping off curb wrong. No fall/trauma-lower back pain since. Pain makes it difficult to walk/stand, but is ambulatory. Denies loss of bowel/bladder, no radiation into legs. HX c spine surgery with hardware- no pain here.

## 2023-10-05 NOTE — ED Provider Notes (Signed)
 [place] EMERGENCY DEPARTMENT AT [facility] Provider Note   CSN: [id number] Arrival date & time: 10/05/23  1430     History {Add pertinent medical, surgical, social history, OB history to HPI:1} Chief Complaint  Patient presents with   Back Pain    Jon Nolan is a 38 y.o. male with past medical history of cervical spine surgery, paroxysmal A-fib not on anticoagulation, chronic pain syndrome sent to emergency room with low back pain.  Patient reports that he was lifting a laundry machine with a friend when he started experiencing low back pain.  He did not have any injury trauma or fall. Worse with walking and with movement.  Pain started gradually in which he had to lower laundry machine.  Denies any radicular symptoms into the leg.  Denies any lower extremity weakness.  Denies any saddle anesthesia.  No loss of bowel or bladder.  Denies history of IV drug use.  Denies history of cancer.   Back Pain      Home Medications Prior to Admission medications   Medication Sig Start Date End Date Taking? Authorizing Provider  acetaminophen (TYLENOL) 325 MG tablet Take 2 tablets (650 mg total) by mouth every 6 (six) hours as needed. 06/19/22   Sloan Leiter, DO  amLODipine (NORVASC) 10 MG tablet Take 1 tablet (10 mg total) by mouth daily. 03/28/22   Storm Frisk, MD  BLACK ELDERBERRY PO Take 1 each by mouth daily. With Vit E and Zinc    [provider]  carvedilol (COREG) 12.5 MG tablet Take 1 tablet (12.5 mg total) by mouth 2 (two) times daily with a meal. 05/04/22   Storm Frisk, MD  cyclobenzaprine (FLEXERIL) 10 MG tablet Take one tablet three times daily as needed for neck pain 03/28/22   Storm Frisk, MD  ibuprofen (ADVIL) 600 MG tablet Take 1 tablet (600 mg total) by mouth every 6 (six) hours as needed. 06/19/22   Sloan Leiter, DO  losartan-hydrochlorothiazide (HYZAAR) 100-25 MG tablet Take 1 tablet by mouth daily. 03/28/22   Storm Frisk, MD  oxyCODONE (ROXICODONE) 5 MG immediate release tablet Take 1 tablet (5 mg total) by mouth every 4 (four) hours as needed for severe pain. 06/19/22   Sloan Leiter, DO  metoprolol succinate (TOPROL-XL) 50 MG 24 hr tablet Take 1 tablet (50 mg total) by mouth daily. Take with or immediately following a meal. Patient not taking: Reported on 08/20/2020 07/06/20 08/20/20  Storm Frisk, MD  valsartan-hydrochlorothiazide (DIOVAN HCT) 80-12.5 MG tablet Take 1 tablet by mouth daily. 08/20/20 09/04/20  Storm Frisk, MD      Allergies    Patient has no known allergies.    Review of Systems   Review of Systems  Musculoskeletal:  Positive for back pain.    Physical Exam Updated Vital Signs BP (!) 158/102   Pulse 73   Temp 98 F (36.7 C) (Oral)   Resp 17   SpO2 100%  Physical Exam Vitals and nursing note reviewed.  Constitutional:      General: He is not in acute distress.    Appearance: He is not toxic-appearing.  HENT:     Head: Normocephalic and atraumatic.  Eyes:     General: No scleral icterus.    Conjunctiva/sclera: Conjunctivae normal.  Cardiovascular:     Rate and Rhythm: Normal rate and regular rhythm.     Pulses: Normal pulses.     Heart sounds: Normal heart  sounds.  Pulmonary:     Effort: Pulmonary effort is normal. No respiratory distress.     Breath sounds: Normal breath sounds.  Abdominal:     General: Abdomen is flat. Bowel sounds are normal.     Palpations: Abdomen is soft.     Tenderness: There is no abdominal tenderness.  Musculoskeletal:     Right lower leg: No edema.     Left lower leg: No edema.     Comments: Bilateral lower extremity tenderness to palpation over paraspinal musculature.  No midline tenderness step-off or deformity.  Skin:    General: Skin is warm and dry.     Findings: No lesion.  Neurological:     General: No focal deficit present.     Mental Status: He is alert and oriented to person, place, and time. Mental status is at  baseline.     ED Results / Procedures / Treatments   Labs (all labs ordered are listed, but only abnormal results are displayed) Labs Reviewed - No data to display  EKG None  Radiology No results found.  Procedures Procedures  {Document cardiac monitor, telemetry assessment procedure when appropriate:1}  Medications Ordered in ED Medications  HYDROcodone-acetaminophen (NORCO/VICODIN) 5-325 MG per tablet 1 tablet (has no administration in time range)  ketorolac (TORADOL) 15 MG/ML injection 15 mg (has no administration in time range)  lidocaine (LIDODERM) 5 % 1 patch (has no administration in time range)    ED Course/ Medical Decision Making/ A&P   {   Click here for ABCD2, HEART and other calculatorsREFRESH Note before signing :1}                              Medical Decision Making Amount and/or Complexity of Data Reviewed Radiology: ordered.  Risk Prescription drug management.   Jon Nolan 38 y.o. presented today for back pain. Working Ddx: MSK in nature, fracture, epidural hematoma/abscess, cauda equina syndrome, spinal stenosis, spinal malignancy, discitis, spinal infection, spondylitises/ spondylosis, conus medullaris, DDD of the back.  R/o DDx: Cauda equina syndrome and additional dx are less likely than current impression due to history of present illness, physical exam, labs/imaging findings. No focal neurological deficits, no loss of bowel or bladder control.  Denies fever, night sweats, weight loss, h/o cancer, IVDU.  PMHX: cervical spine surgery, paroxysmal A-fib not on anticoagulation, chronic pain syndrome    Review of prior external notes: 12/25/22  Imaging: lumbar spine x-ray ordered in triage prior to assessment of patient.   Problem List / ED Course / Critical interventions / Medication management  Patient reporting with low back pain.  He does report an injury of lifting laundry machine.  Exam is over musculature.  Given that he is  otherwise healthy 38 year old feel this is likely secondary to muscle spasm versus muscle strain.  He has no tenderness over the pain.  No red flag symptoms of back pain.  Imaging of lumbar spine ordered.  Will reassess after given pain medications.  Likely stable for discharge. I ordered medication including Norco, Toradol, Lidoderm  Reevaluation of the patient after these medicines showed that the patient improved Patients vitals assessed. Upon arrival patient is hemodynamically stable.  I have reviewed the patients home medicines and have made adjustments as needed    Plan:  F/u w/ PCP in 2-3d to ensure resolution of sx.  RICE protocol and pain medicine discussed with patient.  Patient was given return precautions.  Patient stable for discharge at this time.  Patient educated on sx/dx and verbalized understanding of plan. Return to ER with new or worsening sx.    {Document critical care time when appropriate:1} {Document review of labs and clinical decision tools ie heart score, Chads2Vasc2 etc:1}  {Document your independent review of radiology images, and any outside records:1} {Document your discussion with family members, caretakers, and with consultants:1} {Document social determinants of health affecting pt's care:1} {Document your decision making why or why not admission, treatments were needed:1} Final Clinical Impression(s) / ED Diagnoses Final diagnoses:  None    Rx / DC Orders ED Discharge Orders     None

## 2023-11-13 ENCOUNTER — Ambulatory Visit: Payer: Self-pay | Attending: Family Medicine | Admitting: Nurse Practitioner

## 2023-11-13 ENCOUNTER — Other Ambulatory Visit: Payer: Self-pay

## 2023-11-13 VITALS — BP 179/114 | HR 70 | Resp 19 | Ht 72.0 in | Wt 145.8 lb

## 2023-11-13 DIAGNOSIS — R7303 Prediabetes: Secondary | ICD-10-CM

## 2023-11-13 DIAGNOSIS — K4090 Unilateral inguinal hernia, without obstruction or gangrene, not specified as recurrent: Secondary | ICD-10-CM

## 2023-11-13 DIAGNOSIS — I10 Essential (primary) hypertension: Secondary | ICD-10-CM

## 2023-11-13 DIAGNOSIS — J302 Other seasonal allergic rhinitis: Secondary | ICD-10-CM

## 2023-11-13 DIAGNOSIS — M5441 Lumbago with sciatica, right side: Secondary | ICD-10-CM

## 2023-11-13 MED ORDER — AZELASTINE-FLUTICASONE 137-50 MCG/ACT NA SUSP
1.0000 | Freq: Two times a day (BID) | NASAL | 1 refills | Status: AC
  Filled 2023-11-13: qty 23, fill #0
  Filled 2023-11-23 – 2023-12-10 (×2): qty 46, 60d supply, fill #0

## 2023-11-13 MED ORDER — AZELASTINE HCL 0.05 % OP SOLN
1.0000 [drp] | Freq: Two times a day (BID) | OPHTHALMIC | 12 refills | Status: AC
  Filled 2023-11-13: qty 6, 30d supply, fill #0
  Filled 2023-12-10: qty 6, 30d supply, fill #1

## 2023-11-13 MED ORDER — AMLODIPINE BESYLATE 10 MG PO TABS
10.0000 mg | ORAL_TABLET | Freq: Every day | ORAL | 1 refills | Status: AC
  Filled 2023-11-13: qty 90, 90d supply, fill #0

## 2023-11-13 MED ORDER — CETIRIZINE HCL 10 MG PO TABS
10.0000 mg | ORAL_TABLET | Freq: Every day | ORAL | 1 refills | Status: AC
  Filled 2023-11-13 (×2): qty 90, 90d supply, fill #0

## 2023-11-13 MED ORDER — CLONIDINE HCL 0.2 MG PO TABS
0.2000 mg | ORAL_TABLET | Freq: Once | ORAL | Status: AC
  Administered 2023-11-13: 0.2 mg via ORAL

## 2023-11-13 MED ORDER — LOSARTAN POTASSIUM-HCTZ 100-25 MG PO TABS
1.0000 | ORAL_TABLET | Freq: Every day | ORAL | 1 refills | Status: AC
  Filled 2023-11-13: qty 90, 90d supply, fill #0

## 2023-11-13 MED ORDER — CYCLOBENZAPRINE HCL 10 MG PO TABS
10.0000 mg | ORAL_TABLET | Freq: Three times a day (TID) | ORAL | 1 refills | Status: AC | PRN
  Filled 2023-11-13: qty 60, 20d supply, fill #0

## 2023-11-13 MED ORDER — CARVEDILOL 12.5 MG PO TABS
12.5000 mg | ORAL_TABLET | Freq: Two times a day (BID) | ORAL | 1 refills | Status: AC
  Filled 2023-11-13: qty 180, 90d supply, fill #0

## 2023-11-13 MED ORDER — DULOXETINE HCL 30 MG PO CPEP
30.0000 mg | ORAL_CAPSULE | Freq: Every day | ORAL | 1 refills | Status: AC
  Filled 2023-11-13: qty 60, 30d supply, fill #0
  Filled 2023-12-10: qty 60, 30d supply, fill #1

## 2023-11-13 NOTE — Progress Notes (Signed)
 Assessment & Plan:  Jon "Darrol Poke" was seen today for hypertension.  Diagnoses and all orders for this visit:  Primary hypertension -     cloNIDine (CATAPRES) tablet 0.2 mg -     amLODipine (NORVASC) 10 MG tablet; Take 1 tablet (10 mg total) by mouth daily. -     carvedilol (COREG) 12.5 MG tablet; Take 1 tablet (12.5 mg total) by mouth 2 (two) times daily with a meal. -     losartan-hydrochlorothiazide (HYZAAR) 100-25 MG tablet; Take 1 tablet by mouth daily. -     CMP14+EGFR  Seasonal allergies -     cetirizine (ZYRTEC ALLERGY) 10 MG tablet; Take 1 tablet (10 mg total) by mouth daily. -     Azelastine-Fluticasone 137-50 MCG/ACT SUSP; Place 1 spray into the nose every 12 (twelve) hours. -     azelastine (OPTIVAR) 0.05 % ophthalmic solution; Place 1 drop into both eyes 2 (two) times daily.  Right groin hernia -     Ambulatory referral to General Surgery  Right-sided low back pain with right-sided sciatica, unspecified chronicity -     DULoxetine (CYMBALTA) 30 MG capsule; Take 1-2 capsules (30-60 mg total) by mouth daily. FOR BACK PAIN and RIGHT LEG PAIN -     cyclobenzaprine (FLEXERIL) 10 MG tablet; Take 1 tablet (10 mg total) by mouth 3 (three) times daily as needed for muscle spasms. For back pain and muscle spasms  Prediabetes -     Hemoglobin A1c    Patient has been counseled on age-appropriate routine health concerns for screening and prevention. These are reviewed and up-to-date. Referrals have been placed accordingly. Immunizations are up-to-date or declined.    Subjective:   Chief Complaint  Patient presents with   Hypertension    Jon Nolan 38 y.o. male presents to office today for follow-up to hypertension and with complaints of right-sided sciatica and pain from right groin hernia.  He is a previous patient of Dr. Delford Field.  Has not been seen in this office since 2023.  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    HTN She required 0.2 mg of clonidine today here in the office. Blood pressure is poorly controlled. He is not taking any of his antihypertensives. States he has not been taking care of himself and wants to start back taking his medications. He is currently uninsured. Patient has been advised to apply for financial assistance and schedule to see our financial counselor.  He is currently prescribed amlodipine 10 mg daily, carvedilol 12.5mg  BID, and hyzaar 100-25 mg daily.   BP Readings from Last 3 Encounters:  11/13/23 (!) 179/114  10/05/23 (!) 158/102  12/25/22 (!) 148/105    Allergic Rhinitis: Jon Nolan is here for evaluation of seasonal allergies.  Patient's symptoms include clear rhinorrhea, cough, itchy eyes, itchy nose, itchy palate, nasal congestion, postnasal drip, sneezing, swelling of eyes, and watery eyes. These symptoms are perennial with seasonal exacerbation. Current triggers include exposure to pollens and dust. The patient has been suffering from these symptoms for approximately several weeks. The patient has tried over the counter medications with poor relief of symptoms. Immunotherapy has never been tried. The patient has never had nasal polyps. The patient  has a history of childhood asthma. The patient does not suffer from frequent sinopulmonary infections. The patient has not had sinus surgery in the past. The patient has no history of eczema.      He has a history of chronic lower back pain with right sided sciatica. Was prescribed gabapentin in the past however he states this made him drowsy and he did  not take this.  Back pain started several years ago after a fall.  Pain and numbness radiate from lower back down into right lower leg.   MR lumbar spine 08/13/2020 1. Prominent posterior disc herniation at C4-5 resulting in severe spinal canal stenosis with cord compression. 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 2. No significant degenerative changes of the thoracic or lumbar spine.   Inguinal Hernia: Patient presents for evaluation of right right inguinal hernia. Symptoms were first noted several years ago. Symptoms did not start at work. Pain is sharp, radiating to groin. Lump is not reducible. Pt has no symptoms of  chronic constipation, chronic cough, difficulty urinating. Pt. Does not have previous history of groin surgery. Hernia is approximately 7-8 cm in size   Review of Systems  Constitutional:  Negative for fever, malaise/fatigue and weight loss.  HENT: Negative.  Negative for nosebleeds.   Eyes: Negative.  Negative for blurred vision, double vision and photophobia.  Respiratory: Negative.  Negative for cough and shortness of breath.   Cardiovascular: Negative.  Negative for chest pain, palpitations and leg swelling.  Gastrointestinal: Negative.  Negative for heartburn, nausea and vomiting.  Genitourinary:        Inguinal hernia  Musculoskeletal:  Positive for back pain. Negative for myalgias.  Neurological:  Positive for tingling and sensory change. Negative for dizziness, focal weakness, seizures and headaches.  Endo/Heme/Allergies:  Positive for environmental allergies.  Psychiatric/Behavioral: Negative.  Negative for suicidal ideas.     Past Medical History:  Diagnosis Date   Acute bilateral thoracic back pain 01/06/2021   Asthma    Atrial fibrillation (HCC)    Cervical disc disease with myelopathy 08/20/2020   Cervical spinal stenosis 09/07/2020   Chronic pain syndrome 08/20/2020   Concussion 01/06/2021   COVID-19  08/25/2020   Hypertension    Paroxysmal atrial fibrillation (HCC) 11/25/2021   Pre-diabetes    Protrusion of cervical intervertebral disc 09/03/2020   Rectal bleed 09/30/2020    Past Surgical History:  Procedure Laterality Date   ANTERIOR CERVICAL DECOMP/DISCECTOMY FUSION N/A 09/07/2020   Procedure: CERVICAL FOUR-FIVE ANTERIOR CERVICAL DECOMPRESSION/DISCECTOMY FUSION, ALLOGRAFT PLATE;  Surgeon: Eldred Manges, MD;  Location: [facility] OR;  Service: Orthopedics;  Laterality: N/A;   NO PAST SURGERIES      Family History  Problem Relation Age of Onset   Diabetes Mother    Hypertension Mother    Hypertension Father    Cancer Father    Colon cancer Father    Lung cancer Father     Social History Reviewed with no changes to be made today.   Outpatient Medications Prior to Visit  Medication Sig Dispense Refill   BLACK ELDERBERRY PO Take 1 each by mouth daily. With Vit E and Zinc     lidocaine (LIDODERM) 5 % Place 1 patch onto the skin daily. Remove & Discard patch within 12 hours or as directed by MD 30 patch 0   acetaminophen (TYLENOL) 325 MG tablet Take 2 tablets (650 mg total) by mouth every 6 (six) hours as needed. 36 tablet  0   amLODipine (NORVASC) 10 MG tablet Take 1 tablet (10 mg total) by mouth daily. 90 tablet 3   carvedilol (COREG) 12.5 MG tablet Take 1 tablet (12.5 mg total) by mouth 2 (two) times daily with a meal. 120 tablet 3   ibuprofen (ADVIL) 600 MG tablet Take 1 tablet (600 mg total) by mouth every 6 (six) hours as needed. 30 tablet 0   naproxen (NAPROSYN) 500 MG tablet Take 1 tablet (500 mg total) by mouth 2 (two) times daily. 30 tablet 0   cyclobenzaprine (FLEXERIL) 10 MG tablet Take one tablet three times daily as needed for neck pain (Patient not taking: Reported on 11/13/2023) 60 tablet 1   losartan-hydrochlorothiazide (HYZAAR) 100-25 MG tablet Take 1 tablet by mouth daily. (Patient not taking: Reported on 11/13/2023) 90 tablet 2   oxyCODONE (ROXICODONE) 5 MG immediate release  tablet Take 1 tablet (5 mg total) by mouth every 4 (four) hours as needed for severe pain. (Patient not taking: Reported on 11/13/2023) 8 tablet 0   No facility-administered medications prior to visit.    No Known Allergies     Objective:    BP (!) 179/114 (BP Location: Right Arm, Patient Position: Sitting, Cuff Size: Normal)   Pulse 70   Resp 19   Ht 6' (1.829 m)   Wt 145 lb 12.8 oz (66.1 kg)   SpO2 99%   BMI 19.77 kg/m  Wt Readings from Last 3 Encounters:  11/13/23 145 lb 12.8 oz (66.1 kg)  05/04/22 149 lb 6.4 oz (67.8 kg)  04/13/22 160 lb (72.6 kg)    Physical Exam Vitals and nursing note reviewed.  Constitutional:      Appearance: He is well-developed.  HENT:     Head: Normocephalic and atraumatic.  Cardiovascular:     Rate and Rhythm: Normal rate and regular rhythm.     Heart sounds: Normal heart sounds. No murmur heard.    No friction rub. No gallop.  Pulmonary:     Effort: Pulmonary effort is normal. No tachypnea or respiratory distress.     Breath sounds: Normal breath sounds. No decreased breath sounds, wheezing, rhonchi or rales.  Chest:     Chest wall: No tenderness.  Abdominal:     General: Bowel sounds are normal.     Palpations: Abdomen is soft.     Hernia: A hernia is present. Hernia is present in the right inguinal area.  Musculoskeletal:        General: Normal range of motion.     Cervical back: Normal range of motion.  Skin:    General: Skin is warm and dry.  Neurological:     Mental Status: He is alert and oriented to person, place, and time.     Coordination: Coordination normal.  Psychiatric:        Behavior: Behavior normal. Behavior is cooperative.        Thought Content: Thought content normal.        Judgment: Judgment normal.          Patient has been counseled extensively about nutrition and exercise as well as the importance of adherence with medications and regular follow-up. The patient was given clear instructions to go to ER  or return to medical center if symptoms don't improve, worsen or new problems develop. The patient verbalized understanding.   Follow-up: Return in about 3 months (around 02/12/2024).   Claiborne Rigg, FNP-BC [facility] and [facility] [place], [place] [phone]  11/13/2023, 1:24 PM

## 2023-11-13 NOTE — Progress Notes (Signed)
 Allergies, sciatica nerve pain on right side since feb. Otc taken wit no relief. Naproxen is not working as prescribed.

## 2023-11-14 ENCOUNTER — Encounter: Payer: Self-pay | Admitting: Nurse Practitioner

## 2023-11-14 LAB — CMP14+EGFR
ALT: 17 IU/L (ref 0–44)
AST: 23 IU/L (ref 0–40)
Albumin: 4.4 g/dL (ref 4.1–5.1)
Alkaline Phosphatase: 79 IU/L (ref 44–121)
BUN/Creatinine Ratio: 12 (ref 9–20)
BUN: 13 mg/dL (ref 6–20)
Bilirubin Total: 0.5 mg/dL (ref 0.0–1.2)
CO2: 22 mmol/L (ref 20–29)
Calcium: 9.5 mg/dL (ref 8.7–10.2)
Chloride: 104 mmol/L (ref 96–106)
Creatinine, Ser: 1.12 mg/dL (ref 0.76–1.27)
Globulin, Total: 2.6 g/dL (ref 1.5–4.5)
Glucose: 96 mg/dL (ref 70–99)
Potassium: 5.2 mmol/L (ref 3.5–5.2)
Sodium: 141 mmol/L (ref 134–144)
Total Protein: 7 g/dL (ref 6.0–8.5)
eGFR: 87 mL/min/{1.73_m2} (ref >59)

## 2023-11-14 LAB — HEMOGLOBIN A1C
Est. average glucose Bld gHb Est-mCnc: 128 mg/dL
Hgb A1c MFr Bld: 6.1 % — ABNORMAL HIGH (ref 4.8–5.6)

## 2023-11-17 ENCOUNTER — Other Ambulatory Visit: Payer: Self-pay

## 2023-11-17 ENCOUNTER — Telehealth (HOSPITAL_COMMUNITY): Payer: Self-pay

## 2023-11-17 NOTE — Telephone Encounter (Signed)
 Submitted application for DYMISTA to [employer] for patient assistance.   Phone: [phone]

## 2023-11-20 ENCOUNTER — Ambulatory Visit: Payer: Self-pay | Admitting: Surgery

## 2023-11-21 ENCOUNTER — Other Ambulatory Visit: Payer: Self-pay

## 2023-11-23 ENCOUNTER — Other Ambulatory Visit: Payer: Self-pay

## 2023-12-04 ENCOUNTER — Other Ambulatory Visit: Payer: Self-pay

## 2023-12-08 ENCOUNTER — Ambulatory Visit: Payer: Self-pay | Admitting: Surgery

## 2023-12-10 ENCOUNTER — Other Ambulatory Visit: Payer: Self-pay

## 2023-12-10 ENCOUNTER — Encounter (HOSPITAL_BASED_OUTPATIENT_CLINIC_OR_DEPARTMENT_OTHER): Payer: Self-pay

## 2023-12-10 ENCOUNTER — Emergency Department (HOSPITAL_BASED_OUTPATIENT_CLINIC_OR_DEPARTMENT_OTHER)
Admission: EM | Admit: 2023-12-10 | Discharge: 2023-12-10 | Disposition: A | Discharge status: Home / Self Care | Payer: Self-pay | Attending: Emergency Medicine | Admitting: Emergency Medicine

## 2023-12-10 DIAGNOSIS — B002 Herpesviral gingivostomatitis and pharyngotonsillitis: Secondary | ICD-10-CM

## 2023-12-10 DIAGNOSIS — J45909 Unspecified asthma, uncomplicated: Secondary | ICD-10-CM | POA: Insufficient documentation

## 2023-12-10 DIAGNOSIS — B009 Herpesviral infection, unspecified: Secondary | ICD-10-CM | POA: Insufficient documentation

## 2023-12-10 DIAGNOSIS — Z79899 Other long term (current) drug therapy: Secondary | ICD-10-CM | POA: Insufficient documentation

## 2023-12-10 DIAGNOSIS — Z8616 Personal history of COVID-19: Secondary | ICD-10-CM | POA: Insufficient documentation

## 2023-12-10 DIAGNOSIS — I10 Essential (primary) hypertension: Secondary | ICD-10-CM | POA: Insufficient documentation

## 2023-12-10 MED ORDER — ACYCLOVIR 400 MG PO TABS: 400.0000 mg | ORAL_TABLET | Freq: Every day | ORAL | 0 refills | Status: AC

## 2023-12-10 MED ORDER — ACYCLOVIR 200 MG PO CAPS
400.0000 mg | ORAL_CAPSULE | Freq: Once | ORAL | Status: AC
  Administered 2023-12-10: 400 mg via ORAL
  Filled 2023-12-10: qty 2

## 2023-12-10 NOTE — Discharge Instructions (Addendum)
 Thank you for coming to [facility]. You were seen for a lesion on the lip. 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 It is possible you have sustained a burn from smoking as well but believe herpes is more likely. We swabbed for a culture and you can follow up this result on mychart. This swab is not required for diagnosis.  Please take acyclovir 400 mg by mouth every 4 hours while awake (five times per day) for 5 days. You can alternate taking Tylenol  and ibuprofen  as needed for pain. You can take 650mg  tylenol  (acetaminophen ) every 4-6 hours, and 600 mg ibuprofen  3 times a day.   Please follow up with your primary care provider within 1 week. You can also follow up with a dermatologist.  Do not hesitate to return to the ED or call 911 if you experience: -Worsening symptoms -Swelling inside the mouth -Lightheadedness, passing out -Fevers/chills -Anything else that concerns you

## 2023-12-10 NOTE — ED Triage Notes (Signed)
 Pt advises he woke up for work w swollen lower lip. Swelling localized to center of lower lip, denies injury  Advises he smokes marijuana, blunts.
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
## 2023-12-11 ENCOUNTER — Other Ambulatory Visit: Payer: Self-pay

## 2023-12-15 LAB — HSV CULTURE AND TYPING

## 2023-12-20 ENCOUNTER — Other Ambulatory Visit: Payer: Self-pay

## 2024-02-08 ENCOUNTER — Telehealth: Payer: Self-pay | Admitting: Nurse Practitioner

## 2024-02-08 NOTE — Telephone Encounter (Signed)
 Contacted pt phone busy! Phone not in service. To confirmed appt

## 2024-02-12 ENCOUNTER — Ambulatory Visit: Payer: Self-pay | Attending: Nurse Practitioner | Admitting: Nurse Practitioner
